# Patient Record
Sex: Female | Born: 1975 | Race: White | Hispanic: Yes | Marital: Married | Smoking: Never smoker
Health system: Southern US, Community
[De-identification: ages and names within clinical notes are randomized; demographics above are authoritative.]

## PROBLEM LIST (undated history)

## (undated) DIAGNOSIS — Z789 Other specified health status: Secondary | ICD-10-CM

## (undated) HISTORY — PX: NO PAST SURGERIES: SHX2092

---

## 2000-02-04 ENCOUNTER — Inpatient Hospital Stay (HOSPITAL_COMMUNITY): Admission: AD | Admit: 2000-02-04 | Discharge: 2000-02-04 | Payer: Self-pay | Admitting: Obstetrics

## 2000-02-11 ENCOUNTER — Encounter (HOSPITAL_COMMUNITY): Admission: RE | Admit: 2000-02-11 | Discharge: 2000-02-15 | Payer: Self-pay | Admitting: Obstetrics & Gynecology

## 2000-02-14 ENCOUNTER — Inpatient Hospital Stay (HOSPITAL_COMMUNITY): Admission: AD | Admit: 2000-02-14 | Discharge: 2000-02-17 | Payer: Self-pay | Admitting: Obstetrics & Gynecology

## 2002-05-05 ENCOUNTER — Emergency Department (HOSPITAL_COMMUNITY): Admission: EM | Admit: 2002-05-05 | Discharge: 2002-05-06 | Payer: Self-pay | Admitting: Emergency Medicine

## 2002-05-06 ENCOUNTER — Encounter: Payer: Self-pay | Admitting: Emergency Medicine

## 2004-01-27 ENCOUNTER — Inpatient Hospital Stay (HOSPITAL_COMMUNITY): Admission: AD | Admit: 2004-01-27 | Discharge: 2004-01-27 | Payer: Self-pay | Admitting: Obstetrics

## 2004-04-14 ENCOUNTER — Inpatient Hospital Stay (HOSPITAL_COMMUNITY): Admission: AD | Admit: 2004-04-14 | Discharge: 2004-04-14 | Payer: Self-pay | Admitting: Obstetrics

## 2004-05-01 ENCOUNTER — Inpatient Hospital Stay (HOSPITAL_COMMUNITY): Admission: AD | Admit: 2004-05-01 | Discharge: 2004-05-01 | Payer: Self-pay | Admitting: Obstetrics

## 2004-06-25 ENCOUNTER — Inpatient Hospital Stay (HOSPITAL_COMMUNITY): Admission: AD | Admit: 2004-06-25 | Discharge: 2004-06-25 | Payer: Self-pay | Admitting: Obstetrics

## 2004-06-30 ENCOUNTER — Inpatient Hospital Stay (HOSPITAL_COMMUNITY): Admission: AD | Admit: 2004-06-30 | Discharge: 2004-07-02 | Payer: Self-pay | Admitting: Obstetrics

## 2009-04-09 ENCOUNTER — Emergency Department (HOSPITAL_COMMUNITY): Admission: EM | Admit: 2009-04-09 | Discharge: 2009-04-10 | Payer: Self-pay | Admitting: Emergency Medicine

## 2010-08-25 LAB — BASIC METABOLIC PANEL
CO2: 25 mEq/L (ref 19–32)
Calcium: 8.5 mg/dL (ref 8.4–10.5)
Chloride: 99 mEq/L (ref 96–112)
GFR calc Af Amer: >60 mL/min (ref >60)
Sodium: 135 mEq/L (ref 135–145)

## 2010-08-25 LAB — URINALYSIS, ROUTINE W REFLEX MICROSCOPIC
Ketones, ur: 40 mg/dL — AB
Nitrite: POSITIVE — AB
Urobilinogen, UA: 2 mg/dL — ABNORMAL HIGH (ref 0.0–1.0)

## 2010-08-25 LAB — DIFFERENTIAL
Basophils Relative: 0 % (ref 0–1)
Lymphs Abs: 0.6 10*3/uL — ABNORMAL LOW (ref 0.7–4.0)
Monocytes Absolute: 1.1 10*3/uL — ABNORMAL HIGH (ref 0.1–1.0)
Monocytes Relative: 11 % (ref 3–12)
Neutro Abs: 8.3 10*3/uL — ABNORMAL HIGH (ref 1.7–7.7)

## 2010-08-25 LAB — CBC
Hemoglobin: 11.2 g/dL — ABNORMAL LOW (ref 12.0–15.0)
MCHC: 33.7 g/dL (ref 30.0–36.0)
MCV: 90.8 fL (ref 78.0–100.0)
RBC: 3.68 MIL/uL — ABNORMAL LOW (ref 3.87–5.11)
WBC: 10.1 10*3/uL (ref 4.0–10.5)

## 2010-08-25 LAB — PREGNANCY, URINE: Preg Test, Ur: NEGATIVE

## 2017-05-23 NOTE — L&D Delivery Note (Signed)
Delivery Note At 1:29 PM a viable and healthy female was delivered via Vaginal, Spontaneous (Presentation:LOA  ).  APGAR: 8, 9; weight  .   Placenta status: spontaneous and grossly intact with 3 vessel Cord:  with the following complications: .loose nuchal cord x1  After delivery of head, shoulders did not restitute completely and did not deliver right away We did [place]ro[place]ts and I was able to grasp the posterior axilla (right arm) and deliver the posterior shoulder.  Time from head to body was about 30-40 seconds.  Baby moved arm well afterward  Anesthesia:  epidural Episiotomy: None Lacerations: 2nd degree Suture Repair: 3.0 monocryl Est. Blood Loss (mL):    Mom to postpartum.  Baby to Couplet care / Skin to Skin.  Interpretor present for delivery  Linda BourgeoisMarie Kenrick Dean 04/18/2018, 2:16 PM  Please schedule this patient for Postpartum visit in: 4 weeks with the following provider: Any provider For C/S patients schedule nurse incision check in weeks 2 weeks: no Low risk pregnancy complicated by: none Delivery mode:  SVD Anticipated Birth Control:  POPs PP Procedures needed: none  Schedule Integrated BH visit: no

## 2017-10-12 ENCOUNTER — Encounter: Payer: Self-pay | Admitting: Obstetrics

## 2017-10-17 ENCOUNTER — Encounter: Payer: Self-pay | Admitting: Obstetrics

## 2017-10-17 ENCOUNTER — Ambulatory Visit (INDEPENDENT_AMBULATORY_CARE_PROVIDER_SITE_OTHER): Payer: Self-pay | Admitting: Obstetrics

## 2017-10-17 VITALS — BP 102/64 | HR 69 | Wt 137.0 lb

## 2017-10-17 DIAGNOSIS — Z124 Encounter for screening for malignant neoplasm of cervix: Secondary | ICD-10-CM

## 2017-10-17 DIAGNOSIS — O219 Vomiting of pregnancy, unspecified: Secondary | ICD-10-CM

## 2017-10-17 DIAGNOSIS — J301 Allergic rhinitis due to pollen: Secondary | ICD-10-CM

## 2017-10-17 DIAGNOSIS — O09521 Supervision of elderly multigravida, first trimester: Secondary | ICD-10-CM

## 2017-10-17 DIAGNOSIS — O0990 Supervision of high risk pregnancy, unspecified, unspecified trimester: Secondary | ICD-10-CM

## 2017-10-17 DIAGNOSIS — O09529 Supervision of elderly multigravida, unspecified trimester: Secondary | ICD-10-CM

## 2017-10-17 DIAGNOSIS — O0991 Supervision of high risk pregnancy, unspecified, first trimester: Secondary | ICD-10-CM

## 2017-10-17 DIAGNOSIS — Z1151 Encounter for screening for human papillomavirus (HPV): Secondary | ICD-10-CM

## 2017-10-17 MED ORDER — LORATADINE 10 MG PO TABS: 10.0000 mg | ORAL_TABLET | Freq: Every day | ORAL | 11 refills | Status: DC

## 2017-10-17 MED ORDER — PROMETHAZINE HCL 25 MG PO TABS: 25.0000 mg | ORAL_TABLET | Freq: Four times a day (QID) | ORAL | 2 refills | Status: DC | PRN

## 2017-10-17 MED ORDER — PNV FOLIC ACID + IRON 27-1 MG PO TABS: 1.0000 | ORAL_TABLET | Freq: Every day | ORAL | 4 refills | Status: DC

## 2017-10-17 NOTE — Progress Notes (Signed)
Subjective:    Linda Dean is being seen today for her first obstetrical visit.  This is a planned pregnancy. She is at [redacted]w[redacted]d gestation. Her obstetrical history is significant for AMA. Relationship with FOB: spouse, living together. Patient does intend to breast feed. Pregnancy history fully reviewed.  The information documented in the HPI was reviewed and verified.  Menstrual History: OB History    Gravida  4   Para  3   Term  3   Preterm      AB      Living  3     SAB      TAB      Ectopic      Multiple      Live Births  3           Patient's last menstrual period was 07/12/2017.    History reviewed. No pertinent past medical history.  Past Surgical History:  Procedure Laterality Date  . NO PAST SURGERIES       (Not in a hospital admission) Not on File  Social History   Tobacco Use  . Smoking status: Never Smoker  . Smokeless tobacco: Never Used  Substance Use Topics  . Alcohol use: Not Currently    History reviewed. No pertinent family history.   Review of Systems Constitutional: negative for weight loss Respiratory:  POSITIVE for seasonal allergic rhinitis Gastrointestinal: POSITIVE for nausea Genitourinary:negative for genital lesions and vaginal discharge and dysuria Musculoskeletal:negative for back pain Behavioral/Psych: negative for abusive relationship, depression, illegal drug usage and tobacco use    Objective:    BP 102/64   Pulse 69   Wt 137 lb (62.1 kg)   LMP 07/12/2017  General Appearance:    Alert, cooperative, no distress, appears stated age  Head:    Normocephalic, without obvious abnormality, atraumatic  Eyes:    PERRL, conjunctiva/corneas clear, EOM's intact, fundi    benign, both eyes  Ears:    Normal TM's and external ear canals, both ears  Nose:   Nares normal, septum midline, mucosa normal, no drainage    or sinus tenderness  Throat:   Lips, mucosa, and tongue normal; teeth and gums normal  Neck:    Supple, symmetrical, trachea midline, no adenopathy;    thyroid:  no enlargement/tenderness/nodules; no carotid   bruit or JVD  Back:     Symmetric, no curvature, ROM normal, no CVA tenderness  Lungs:     Clear to auscultation bilaterally, respirations unlabored  Chest Wall:    No tenderness or deformity   Heart:    Regular rate and rhythm, S1 and S2 normal, no murmur, rub   or gallop  Breast Exam:    No tenderness, masses, or nipple abnormality  Abdomen:     Soft, non-tender, bowel sounds active all four quadrants,    no masses, no organomegaly  Genitalia:    Normal female without lesion, discharge or tenderness  Extremities:   Extremities normal, atraumatic, no cyanosis or edema  Pulses:   2+ and symmetric all extremities  Skin:   Skin color, texture, turgor normal, no rashes or lesions  Lymph nodes:   Cervical, supraclavicular, and axillary nodes normal  Neurologic:   CNII-XII intact, normal strength, sensation and reflexes    throughout      Lab Review Urine pregnancy test Labs reviewed yes Radiologic studies reviewed no   Assessment:    Pregnancy at [redacted]w[redacted]d weeks    Plan:     1.  Supervision of high risk pregnancy, antepartum Rx: - Cytology - PAP - Obstetric Panel, Including HIV - Culture, OB Urine - VITAMIN D 25 Hydroxy (Vit-D Deficiency, Fractures) - Hemoglobinopathy evaluation - Hemoglobin A1c - Prenatal Vit-Fe Fumarate-FA (PNV FOLIC ACID + IRON) 27-1 MG TABS; Take 1 tablet by mouth daily before breakfast.  Dispense: 90 tablet; Refill: 4  2. Supervision of elderly multigravida, antepartum - DECLINED GENETIC COUNSELING  3. Nausea and vomiting during pregnancy prior to [redacted] weeks gestation Rx: - promethazine (PHENERGAN) 25 MG tablet; Take 1 tablet (25 mg total) by mouth every 6 (six) hours as needed for nausea or vomiting.  Dispense: 30 tablet; Refill: 2  4. Seasonal allergic rhinitis due to pollen Rx: - loratadine (CLARITIN) 10 MG tablet; Take 1 tablet (10 mg  total) by mouth daily.  Dispense: 30 tablet; Refill: 11  Prenatal vitamins.  Counseling provided regarding continued use of seat belts, cessation of alcohol consumption, smoking or use of illicit drugs; infection precautions i.e., influenza/TDAP immunizations, toxoplasmosis,CMV, parvovirus, listeria and varicella; workplace safety, exercise during pregnancy; routine dental care, safe medications, sexual activity, hot tubs, saunas, pools, travel, caffeine use, fish and methlymercury, potential toxins, hair treatments, varicose veins Weight gain recommendations per IOM guidelines reviewed: underweight/BMI< 18.5--> gain 28 - 40 lbs; normal weight/BMI 18.5 - 24.9--> gain 25 - 35 lbs; overweight/BMI 25 - 29.9--> gain 15 - 25 lbs; obese/BMI >30->gain  11 - 20 lbs Problem list reviewed and updated. FIRST/CF mutation testing/NIPT/QUAD SCREEN/fragile X/Ashkenazi Jewish population testing/Spinal muscular atrophy discussed: declined. Role of ultrasound in pregnancy discussed; fetal survey: requested. Amniocentesis discussed: declined.  Meds ordered this encounter  Medications  . promethazine (PHENERGAN) 25 MG tablet    Sig: Take 1 tablet (25 mg total) by mouth every 6 (six) hours as needed for nausea or vomiting.    Dispense:  30 tablet    Refill:  2  . loratadine (CLARITIN) 10 MG tablet    Sig: Take 1 tablet (10 mg total) by mouth daily.    Dispense:  30 tablet    Refill:  11  . Prenatal Vit-Fe Fumarate-FA (PNV FOLIC ACID + IRON) 27-1 MG TABS    Sig: Take 1 tablet by mouth daily before breakfast.    Dispense:  90 tablet    Refill:  4   Orders Placed This Encounter  Procedures  . Culture, OB Urine  . Obstetric Panel, Including HIV  . VITAMIN D 25 Hydroxy (Vit-D Deficiency, Fractures)  . Hemoglobinopathy evaluation  . Hemoglobin A1c    Follow up in 4 weeks. 50% of 20 min visit spent on counseling and coordination of care.     Brock Bad MD 10-17-2017

## 2017-10-20 ENCOUNTER — Other Ambulatory Visit: Payer: Self-pay | Admitting: Obstetrics

## 2017-10-20 DIAGNOSIS — E559 Vitamin D deficiency, unspecified: Secondary | ICD-10-CM

## 2017-10-20 LAB — HEMOGLOBINOPATHY EVALUATION
HEMOGLOBIN A2 QUANTITATION: 2.5 % (ref 1.8–3.2)
HGB C: 0 %
HGB S: 0 %
HGB VARIANT: 0 %
Hemoglobin F Quantitation: 0 % (ref 0.0–2.0)
Hgb A: 97.5 % (ref 96.4–98.8)

## 2017-10-20 LAB — OBSTETRIC PANEL, INCLUDING HIV
ANTIBODY SCREEN: NEGATIVE
BASOS: 0 %
Basophils Absolute: 0 10*3/uL (ref 0.0–0.2)
EOS (ABSOLUTE): 0.4 10*3/uL (ref 0.0–0.4)
EOS: 4 %
HEMATOCRIT: 35.2 % (ref 34.0–46.6)
HEMOGLOBIN: 11.8 g/dL (ref 11.1–15.9)
HIV Screen 4th Generation wRfx: NONREACTIVE
Hepatitis B Surface Ag: NEGATIVE
IMMATURE GRANS (ABS): 0 10*3/uL (ref 0.0–0.1)
Immature Granulocytes: 0 %
LYMPHS: 14 %
Lymphocytes Absolute: 1.3 10*3/uL (ref 0.7–3.1)
MCH: 29.9 pg (ref 26.6–33.0)
MCHC: 33.5 g/dL (ref 31.5–35.7)
MCV: 89 fL (ref 79–97)
MONOS ABS: 0.5 10*3/uL (ref 0.1–0.9)
Monocytes: 6 %
Neutrophils Absolute: 6.8 10*3/uL (ref 1.4–7.0)
Neutrophils: 76 %
Platelets: 295 10*3/uL (ref 150–450)
RBC: 3.95 x10E6/uL (ref 3.77–5.28)
RDW: 14.3 % (ref 12.3–15.4)
RH TYPE: POSITIVE
RPR Ser Ql: NONREACTIVE
RUBELLA: 2.86 {index} (ref >0.99)
WBC: 9 10*3/uL (ref 3.4–10.8)

## 2017-10-20 LAB — VITAMIN D 25 HYDROXY (VIT D DEFICIENCY, FRACTURES): Vit D, 25-Hydroxy: 17.9 ng/mL — ABNORMAL LOW (ref 30.0–100.0)

## 2017-10-20 LAB — CYTOLOGY - PAP
Diagnosis: NEGATIVE
HPV (WINDOPATH): NOT DETECTED

## 2017-10-20 LAB — HEMOGLOBIN A1C
Est. average glucose Bld gHb Est-mCnc: 111 mg/dL
Hgb A1c MFr Bld: 5.5 % (ref 4.8–5.6)

## 2017-10-20 MED ORDER — VITAMIN D 50 MCG (2000 UT) PO CAPS: 1.0000 | ORAL_CAPSULE | Freq: Every day | ORAL | 11 refills | Status: DC

## 2017-10-21 ENCOUNTER — Other Ambulatory Visit: Payer: Self-pay | Admitting: Obstetrics

## 2017-10-21 DIAGNOSIS — N3000 Acute cystitis without hematuria: Secondary | ICD-10-CM

## 2017-10-21 LAB — URINE CULTURE, OB REFLEX

## 2017-10-21 LAB — CULTURE, OB URINE

## 2017-10-21 MED ORDER — AMOXICILLIN-POT CLAVULANATE 875-125 MG PO TABS: 1.0000 | ORAL_TABLET | Freq: Two times a day (BID) | ORAL | 0 refills | Status: DC

## 2017-10-22 ENCOUNTER — Encounter: Payer: Self-pay | Admitting: Family Medicine

## 2017-10-22 DIAGNOSIS — O0990 Supervision of high risk pregnancy, unspecified, unspecified trimester: Secondary | ICD-10-CM | POA: Insufficient documentation

## 2017-10-22 DIAGNOSIS — O09519 Supervision of elderly primigravida, unspecified trimester: Secondary | ICD-10-CM | POA: Insufficient documentation

## 2017-10-24 ENCOUNTER — Encounter: Payer: Self-pay | Admitting: *Deleted

## 2017-11-14 ENCOUNTER — Other Ambulatory Visit: Payer: Self-pay

## 2017-11-14 ENCOUNTER — Encounter: Payer: Self-pay | Admitting: Obstetrics

## 2017-11-14 ENCOUNTER — Ambulatory Visit (INDEPENDENT_AMBULATORY_CARE_PROVIDER_SITE_OTHER): Payer: Self-pay | Admitting: Obstetrics

## 2017-11-14 VITALS — BP 97/60 | HR 62 | Wt 143.2 lb

## 2017-11-14 DIAGNOSIS — O09522 Supervision of elderly multigravida, second trimester: Secondary | ICD-10-CM

## 2017-11-14 DIAGNOSIS — O0992 Supervision of high risk pregnancy, unspecified, second trimester: Secondary | ICD-10-CM

## 2017-11-14 DIAGNOSIS — O09529 Supervision of elderly multigravida, unspecified trimester: Secondary | ICD-10-CM

## 2017-11-14 DIAGNOSIS — O0990 Supervision of high risk pregnancy, unspecified, unspecified trimester: Secondary | ICD-10-CM

## 2017-11-14 NOTE — Progress Notes (Signed)
Subjective:  Linda GelinasMaria De La Micael [name] is a 42 y.o. [phone] at 2035w6d being seen today for ongoing prenatal care.  She is currently monitored for the following issues for this high-risk pregnancy and has Supervision of high risk pregnancy, antepartum and AMA (advanced maternal age) primigravida 35+ on their problem list.  Patient reports no complaints.  Contractions: Not present. Vag. Bleeding: None.  Movement: Present. Denies leaking of fluid.   The following portions of the patient's history were reviewed and updated as appropriate: allergies, current medications, past family history, past medical history, past social history, past surgical history and problem list. Problem list updated.  Objective:   Vitals:   11/14/17 0825  BP: 97/60  Pulse: 62  Weight: 143 lb 3.2 oz (65 kg)    Fetal Status: Fetal Heart Rate (bpm): 150   Movement: Present     General:  Alert, oriented and cooperative. Patient is in no acute distress.  Skin: Skin is warm and dry. No rash noted.   Cardiovascular: Normal heart rate noted  Respiratory: Normal respiratory effort, no problems with respiration noted  Abdomen: Soft, gravid, appropriate for gestational age. Pain/Pressure: Absent     Pelvic:  Cervical exam deferred        Extremities: Normal range of motion.  Edema: None  Mental Status: Normal mood and affect. Normal behavior. Normal judgment and thought content.   Urinalysis:      Assessment and Plan:  Pregnancy: G4P3003 at 7435w6d  1. Supervision of high risk pregnancy, antepartum -AMA.  Declines Genetic Counseling and Testing  2. Supervision of elderly multigravida, antepartum   Preterm labor symptoms and general obstetric precautions including but not limited to vaginal bleeding, contractions, leaking of fluid and fetal movement were reviewed in detail with the patient. Please refer to After Visit Summary for other counseling recommendations.  Return in about 1 month (around 12/12/2017) for  ROB.   Brock BadHarper, Charles A, MD

## 2017-12-04 ENCOUNTER — Telehealth: Payer: Self-pay

## 2017-12-04 ENCOUNTER — Encounter: Payer: Self-pay | Admitting: Obstetrics and Gynecology

## 2017-12-04 DIAGNOSIS — O4402 Placenta previa specified as without hemorrhage, second trimester: Secondary | ICD-10-CM | POA: Insufficient documentation

## 2017-12-04 NOTE — Telephone Encounter (Signed)
Received a call from the health dept. with preliminary US results for this pt, they stated she has complete placenta previa. I called pt and spoke with her daughter who translated for the pt who speaks spanish. Gave her bleeding precautions and told her the pt needs to have complete pelvic rest, which means no intercourse. Also told her to tell pt that we will do another ultrasound in a couple months to see if the previa has resolved. I advised that nothing is wrong with the baby but reiterated that it is very important for the pt to get medical attention immediately if she begins having vaginal bleeding at any time. Pts daughter and pt verbalized understanding.

## 2017-12-12 ENCOUNTER — Ambulatory Visit (INDEPENDENT_AMBULATORY_CARE_PROVIDER_SITE_OTHER): Payer: Self-pay | Admitting: Obstetrics

## 2017-12-12 ENCOUNTER — Encounter: Payer: Self-pay | Admitting: Obstetrics

## 2017-12-12 VITALS — BP 103/65 | HR 66 | Wt 146.7 lb

## 2017-12-12 DIAGNOSIS — O09529 Supervision of elderly multigravida, unspecified trimester: Secondary | ICD-10-CM

## 2017-12-12 DIAGNOSIS — O0992 Supervision of high risk pregnancy, unspecified, second trimester: Secondary | ICD-10-CM

## 2017-12-12 DIAGNOSIS — O4402 Placenta previa specified as without hemorrhage, second trimester: Secondary | ICD-10-CM

## 2017-12-12 DIAGNOSIS — O09522 Supervision of elderly multigravida, second trimester: Secondary | ICD-10-CM

## 2017-12-12 DIAGNOSIS — O0990 Supervision of high risk pregnancy, unspecified, unspecified trimester: Secondary | ICD-10-CM

## 2017-12-12 NOTE — Progress Notes (Signed)
Patient reports good fetal movement with occasional uterine irritability. 

## 2017-12-12 NOTE — Progress Notes (Signed)
Subjective:  Linda GelinasMaria De La Micael [name] is a 42 y.o. [phone] at 8211w6d being seen today for ongoing prenatal care.  She is currently monitored for the following issues for this high-risk pregnancy and has Supervision of high risk pregnancy, antepartum; AMA (advanced maternal age) primigravida 35+; and Complete placenta previa nos or without hemorrhage, second trimester on their problem list.  Patient reports backache.  Contractions: Irritability. Vag. Bleeding: None.  Movement: Present. Denies leaking of fluid.   The following portions of the patient's history were reviewed and updated as appropriate: allergies, current medications, past family history, past medical history, past social history, past surgical history and problem list. Problem list updated.  Objective:   Vitals:   12/12/17 1106  BP: 103/65  Pulse: 66  Weight: 146 lb 11.2 oz (66.5 kg)    Fetal Status: Fetal Heart Rate (bpm): 150   Movement: Present     General:  Alert, oriented and cooperative. Patient is in no acute distress.  Skin: Skin is warm and dry. No rash noted.   Cardiovascular: Normal heart rate noted  Respiratory: Normal respiratory effort, no problems with respiration noted  Abdomen: Soft, gravid, appropriate for gestational age. Pain/Pressure: Present     Pelvic:  Cervical exam deferred        Extremities: Normal range of motion.  Edema: None  Mental Status: Normal mood and affect. Normal behavior. Normal judgment and thought content.   Urinalysis:      Assessment and Plan:  Pregnancy: [id number] at 1711w6d  1. Supervision of high risk pregnancy, antepartum  2. Supervision of elderly multigravida, antepartum  3. Total placenta previa, second trimester - follow up ultrasound at 28 weeks - previa precautions  Preterm labor symptoms and general obstetric precautions including but not limited to vaginal bleeding, contractions, leaking of fluid and fetal movement were reviewed in detail with the patient. Please  refer to After Visit Summary for other counseling recommendations.  Return in about 1 month (around 01/09/2018) for ROB.   Brock BadHarper, Samai Corea A, MD

## 2018-01-09 ENCOUNTER — Encounter: Payer: Self-pay | Admitting: Obstetrics

## 2018-01-09 ENCOUNTER — Ambulatory Visit (INDEPENDENT_AMBULATORY_CARE_PROVIDER_SITE_OTHER): Payer: Self-pay | Admitting: Obstetrics

## 2018-01-09 DIAGNOSIS — O0992 Supervision of high risk pregnancy, unspecified, second trimester: Secondary | ICD-10-CM

## 2018-01-09 DIAGNOSIS — O0990 Supervision of high risk pregnancy, unspecified, unspecified trimester: Secondary | ICD-10-CM

## 2018-01-09 NOTE — Progress Notes (Signed)
Patient reports good fetal movement, denies pain. 

## 2018-01-09 NOTE — Progress Notes (Signed)
Subjective:  Linda GelinasMaria De La Micael [name] is a 42 y.o. [phone] at 1470w6d being seen today for ongoing prenatal care.  She is currently monitored for the following issues for this high-risk pregnancy and has Supervision of high risk pregnancy, antepartum; AMA (advanced maternal age) primigravida 35+; and Complete placenta previa nos or without hemorrhage, second trimester on their problem list.  Patient reports no complaints.  Contractions: Not present. Vag. Bleeding: None.  Movement: Present. Denies leaking of fluid.   The following portions of the patient's history were reviewed and updated as appropriate: allergies, current medications, past family history, past medical history, past social history, past surgical history and problem list. Problem list updated.  Objective:   Vitals:   01/09/18 1036  BP: (!) 103/59  Pulse: 66  Weight: 149 lb 6.4 oz (67.8 kg)    Fetal Status: Fetal Heart Rate (bpm): 150   Movement: Present     General:  Alert, oriented and cooperative. Patient is in no acute distress.  Skin: Skin is warm and dry. No rash noted.   Cardiovascular: Normal heart rate noted  Respiratory: Normal respiratory effort, no problems with respiration noted  Abdomen: Soft, gravid, appropriate for gestational age. Pain/Pressure: Absent     Pelvic:  Cervical exam deferred        Extremities: Normal range of motion.  Edema: None  Mental Status: Normal mood and affect. Normal behavior. Normal judgment and thought content.   Urinalysis:      Assessment and Plan:  Pregnancy: G4P3003 at 8570w6d  1. Supervision of high risk pregnancy, antepartum   Preterm labor symptoms and general obstetric precautions including but not limited to vaginal bleeding, contractions, leaking of fluid and fetal movement were reviewed in detail with the patient. Please refer to After Visit Summary for other counseling recommendations.  Return in about 3 weeks (around 01/30/2018) for ROB, 2 hour OGTT.   Brock BadHarper,  Charles A, MD

## 2018-01-30 ENCOUNTER — Ambulatory Visit (INDEPENDENT_AMBULATORY_CARE_PROVIDER_SITE_OTHER): Payer: Self-pay | Admitting: Obstetrics

## 2018-01-30 ENCOUNTER — Other Ambulatory Visit: Payer: Self-pay

## 2018-01-30 ENCOUNTER — Encounter: Payer: Self-pay | Admitting: Obstetrics

## 2018-01-30 VITALS — BP 110/70 | HR 71 | Wt 151.0 lb

## 2018-01-30 DIAGNOSIS — O0993 Supervision of high risk pregnancy, unspecified, third trimester: Secondary | ICD-10-CM

## 2018-01-30 DIAGNOSIS — O0990 Supervision of high risk pregnancy, unspecified, unspecified trimester: Secondary | ICD-10-CM

## 2018-01-30 DIAGNOSIS — O4403 Placenta previa specified as without hemorrhage, third trimester: Secondary | ICD-10-CM

## 2018-01-30 DIAGNOSIS — O09523 Supervision of elderly multigravida, third trimester: Secondary | ICD-10-CM

## 2018-01-30 NOTE — Progress Notes (Signed)
Subjective:  Linda Dean is a 42 y.o. [phone] at [redacted]w[redacted]d being seen today for ongoing prenatal care.  She is currently monitored for the following issues for this high-risk pregnancy and has Supervision of high risk pregnancy, antepartum; AMA (advanced maternal age) primigravida 35+; and Complete placenta previa nos or without hemorrhage, second trimester on their problem list.  Patient reports no complaints.  Contractions: Irregular. Vag. Bleeding: None.  Movement: Present. Denies leaking of fluid.   The following portions of the patient's history were reviewed and updated as appropriate: allergies, current medications, past family history, past medical history, past social history, past surgical history and problem list. Problem list updated.  Objective:   Vitals:   01/30/18 0824  BP: 110/70  Pulse: 71  Weight: 151 lb (68.5 kg)    Fetal Status:     Movement: Present     General:  Alert, oriented and cooperative. Patient is in no acute distress.  Skin: Skin is warm and dry. No rash noted.   Cardiovascular: Normal heart rate noted  Respiratory: Normal respiratory effort, no problems with respiration noted  Abdomen: Soft, gravid, appropriate for gestational age. Pain/Pressure: Present     Pelvic:  Cervical exam deferred        Extremities: Normal range of motion.  Edema: None  Mental Status: Normal mood and affect. Normal behavior. Normal judgment and thought content.   Urinalysis:      Assessment and Plan:  Pregnancy: G4P3003 at [redacted]w[redacted]d  1. Supervision of high risk pregnancy, antepartum Rx: - Glucose Tolerance, 2 Hours w/1 Hour - CBC - HIV antibody (with reflex) - RPR  Preterm labor symptoms and general obstetric precautions including but not limited to vaginal bleeding, contractions, leaking of fluid and fetal movement were reviewed in detail with the patient. Please refer to After Visit Summary for other counseling recommendations.  Return in about 2 weeks (around  02/13/2018) for ROB.   Brock Bad, MD

## 2018-01-30 NOTE — Progress Notes (Signed)
ROB/GTT.  [facility] given [facility] FLU and TDAP vaccines.

## 2018-01-31 LAB — CBC
Hematocrit: 35 % (ref 34.0–46.6)
Hemoglobin: 11.3 g/dL (ref 11.1–15.9)
MCH: 30.4 pg (ref 26.6–33.0)
MCHC: 32.3 g/dL (ref 31.5–35.7)
MCV: 94 fL (ref 79–97)
PLATELETS: 287 10*3/uL (ref 150–450)
RBC: 3.72 x10E6/uL — AB (ref 3.77–5.28)
RDW: 12.8 % (ref 12.3–15.4)
WBC: 6.8 10*3/uL (ref 3.4–10.8)

## 2018-01-31 LAB — GLUCOSE TOLERANCE, 2 HOURS W/ 1HR
Glucose, 1 hour: 157 mg/dL (ref 65–179)
Glucose, 2 hour: 107 mg/dL (ref 65–152)
Glucose, Fasting: 84 mg/dL (ref 65–91)

## 2018-01-31 LAB — RPR: RPR Ser Ql: NONREACTIVE

## 2018-01-31 LAB — HIV ANTIBODY (ROUTINE TESTING W REFLEX): HIV SCREEN 4TH GENERATION: NONREACTIVE

## 2018-02-13 ENCOUNTER — Ambulatory Visit (INDEPENDENT_AMBULATORY_CARE_PROVIDER_SITE_OTHER): Payer: Self-pay | Admitting: Obstetrics

## 2018-02-13 ENCOUNTER — Encounter: Payer: Self-pay | Admitting: Obstetrics

## 2018-02-13 VITALS — BP 109/62 | HR 69 | Wt 153.0 lb

## 2018-02-13 DIAGNOSIS — O0990 Supervision of high risk pregnancy, unspecified, unspecified trimester: Secondary | ICD-10-CM

## 2018-02-13 DIAGNOSIS — O4443 Low lying placenta NOS or without hemorrhage, third trimester: Secondary | ICD-10-CM

## 2018-02-13 DIAGNOSIS — O09529 Supervision of elderly multigravida, unspecified trimester: Secondary | ICD-10-CM

## 2018-02-13 NOTE — Progress Notes (Signed)
Subjective:  Linda Dean is a 42 y.o. [phone] at 6856w6d being seen today for ongoing prenatal care.  She is currently monitored for the following issues for this high-risk pregnancy and has Supervision of high risk pregnancy, antepartum; AMA (advanced maternal age) primigravida 35+; and Complete placenta previa nos or without hemorrhage, second trimester on their problem list.  Patient reports no complaints.  Contractions: Not present. Vag. Bleeding: None.  Movement: Present. Denies leaking of fluid.   The following portions of the patient's history were reviewed and updated as appropriate: allergies, current medications, past family history, past medical history, past social history, past surgical history and problem list. Problem list updated.  Objective:   Vitals:   02/13/18 1034  BP: 109/62  Pulse: 69  Weight: 153 lb (69.4 kg)    Fetal Status: Fetal Heart Rate (bpm): 150   Movement: Present     General:  Alert, oriented and cooperative. Patient is in no acute distress.  Skin: Skin is warm and dry. No rash noted.   Cardiovascular: Normal heart rate noted  Respiratory: Normal respiratory effort, no problems with respiration noted  Abdomen: Soft, gravid, appropriate for gestational age. Pain/Pressure: Present     Pelvic:  Cervical exam deferred        Extremities: Normal range of motion.  Edema: None  Mental Status: Normal mood and affect. Normal behavior. Normal judgment and thought content.   Urinalysis:      Assessment and Plan:  Pregnancy: G4P3003 at 4156w6d  1. Supervision of high risk pregnancy, antepartum  2. Supervision of elderly multigravida, antepartum  3. Low lying anterior placenta nos or without hemorrhage, third trimester ( 1.2CM FROM CERVICAL OS ) - WILL REPEAT ULTRASOUND IN 1 MONTH  Media Information       Document Information   Procedure Note  [place] Report-[facility]  02/07/2018 09:01  Attached To:  Procedure Report - Scanned  [[id number]]  Scanned Document on 02/07/18 with [provider]  Source Information   Almon Registeraniels, Aneetra D  [facility]    Preterm labor symptoms and general obstetric precautions including but not limited to vaginal bleeding, contractions, leaking of fluid and fetal movement were reviewed in detail with the patient. Please refer to After Visit Summary for other counseling recommendations.  No follow-ups on file.   Brock BadHarper, Charles A, MD

## 2018-03-01 ENCOUNTER — Encounter: Payer: Self-pay | Admitting: Obstetrics

## 2018-03-01 ENCOUNTER — Ambulatory Visit (INDEPENDENT_AMBULATORY_CARE_PROVIDER_SITE_OTHER): Payer: Self-pay | Admitting: Obstetrics

## 2018-03-01 VITALS — BP 96/53 | HR 76 | Wt 154.3 lb

## 2018-03-01 DIAGNOSIS — O4443 Low lying placenta NOS or without hemorrhage, third trimester: Secondary | ICD-10-CM

## 2018-03-01 DIAGNOSIS — O09523 Supervision of elderly multigravida, third trimester: Secondary | ICD-10-CM

## 2018-03-01 DIAGNOSIS — O09529 Supervision of elderly multigravida, unspecified trimester: Secondary | ICD-10-CM

## 2018-03-01 DIAGNOSIS — O0990 Supervision of high risk pregnancy, unspecified, unspecified trimester: Secondary | ICD-10-CM

## 2018-03-01 DIAGNOSIS — O0993 Supervision of high risk pregnancy, unspecified, third trimester: Secondary | ICD-10-CM

## 2018-03-01 NOTE — Progress Notes (Signed)
Pt presents for ROB. Pt has no concerns today. 

## 2018-03-01 NOTE — Progress Notes (Signed)
Subjective:  Linda Dean is a 42 y.o. [phone] at [redacted]w[redacted]d being seen today for ongoing prenatal care.  She is currently monitored for the following issues for this high-risk pregnancy and has Supervision of high risk pregnancy, antepartum; AMA (advanced maternal age) primigravida 35+; and Complete placenta previa nos or without hemorrhage, second trimester on their problem list.  Patient reports no complaints.  Contractions: Irregular. Vag. Bleeding: None.  Movement: Present. Denies leaking of fluid.   The following portions of the patient's history were reviewed and updated as appropriate: allergies, current medications, past family history, past medical history, past social history, past surgical history and problem list. Problem list updated.  Objective:   Vitals:   03/01/18 1000  BP: (!) 96/53  Pulse: 76  Weight: 154 lb 4.8 oz (70 kg)    Fetal Status: Fetal Heart Rate (bpm): 150   Movement: Present     General:  Alert, oriented and cooperative. Patient is in no acute distress.  Skin: Skin is warm and dry. No rash noted.   Cardiovascular: Normal heart rate noted  Respiratory: Normal respiratory effort, no problems with respiration noted  Abdomen: Soft, gravid, appropriate for gestational age. Pain/Pressure: Present     Pelvic:  Cervical exam deferred        Extremities: Normal range of motion.  Edema: None  Mental Status: Normal mood and affect. Normal behavior. Normal judgment and thought content.   Urinalysis:     Ultrasound: Media Information       Document Information   Procedure Note  [place] Report[facility]  02/07/2018 09:01  Attached To:  Procedure Report - Scanned [[id number]]  Scanned Document on 02/07/18 with [provider]  Source Information   Almon Register D  [facility]    Assessment and Plan:  Pregnancy: G4P3003 at [redacted]w[redacted]d  1. Supervision of high risk pregnancy, antepartum  2. Supervision of elderly  multigravida, antepartum  3. Low lying placenta nos or without hemorrhage, third trimester   Preterm labor symptoms and general obstetric precautions including but not limited to vaginal bleeding, contractions, leaking of fluid and fetal movement were reviewed in detail with the patient. Please refer to After Visit Summary for other counseling recommendations.  Return in about 2 weeks (around 03/15/2018) for ROB.   Brock Bad, MD

## 2018-03-13 ENCOUNTER — Ambulatory Visit (INDEPENDENT_AMBULATORY_CARE_PROVIDER_SITE_OTHER): Payer: Self-pay | Admitting: Obstetrics

## 2018-03-13 ENCOUNTER — Encounter: Payer: Self-pay | Admitting: Obstetrics

## 2018-03-13 VITALS — BP 110/58 | HR 80 | Wt 158.0 lb

## 2018-03-13 DIAGNOSIS — O09529 Supervision of elderly multigravida, unspecified trimester: Secondary | ICD-10-CM

## 2018-03-13 DIAGNOSIS — O09523 Supervision of elderly multigravida, third trimester: Secondary | ICD-10-CM

## 2018-03-13 DIAGNOSIS — O0993 Supervision of high risk pregnancy, unspecified, third trimester: Secondary | ICD-10-CM

## 2018-03-13 DIAGNOSIS — O0990 Supervision of high risk pregnancy, unspecified, unspecified trimester: Secondary | ICD-10-CM

## 2018-03-13 DIAGNOSIS — O4443 Low lying placenta NOS or without hemorrhage, third trimester: Secondary | ICD-10-CM

## 2018-03-13 NOTE — Progress Notes (Signed)
ROB.  Declined FLU and TDAP.

## 2018-03-13 NOTE — Progress Notes (Signed)
Subjective:  Linda Dean Celina Shiley is a 42 y.o. [phone] at [redacted]w[redacted]d being seen today for ongoing prenatal care.  She is currently monitored for the following issues for this high-risk pregnancy and has Supervision of high risk pregnancy, antepartum; AMA (advanced maternal age) primigravida 35+; and Complete placenta previa nos or without hemorrhage, second trimester on their problem list.  Patient reports no complaints.  Contractions: Irritability. Vag. Bleeding: None.  Movement: Present. Denies leaking of fluid.   The following portions of the patient's history were reviewed and updated as appropriate: allergies, current medications, past family history, past medical history, past social history, past surgical history and problem list. Problem list updated.  Objective:   Vitals:   03/13/18 1546  BP: (!) 110/58  Pulse: 80  Weight: 158 lb (71.7 kg)    Fetal Status: Fetal Heart Rate (bpm): 150   Movement: Present     General:  Alert, oriented and cooperative. Patient is in no acute distress.  Skin: Skin is warm and dry. No rash noted.   Cardiovascular: Normal heart rate noted  Respiratory: Normal respiratory effort, no problems with respiration noted  Abdomen: Soft, gravid, appropriate for gestational age. Pain/Pressure: Present     Pelvic:  Cervical exam deferred        Extremities: Normal range of motion.  Edema: None  Mental Status: Normal mood and affect. Normal behavior. Normal judgment and thought content.   Urinalysis:      ULTRASOUND: Media Information       Document Information   Procedure Note  [place] Report-[facility]  03/08/2018 11:57  Attached To:  Procedure Report - Scanned [[id number]]  Scanned Document on 03/08/18 with [provider]  Source Information   Almon Register D  [facility]    Assessment and Plan:  Pregnancy: G4P3003 at [redacted]w[redacted]d  1. Supervision of high risk pregnancy, antepartum  2. Supervision of elderly  multigravida, antepartum  3. Low lying placenta nos or without hemorrhage, third trimester - RESOLVED, ON ULTRASOUND 03-08-2018.  NO PREVIA.   Preterm labor symptoms and general obstetric precautions including but not limited to vaginal bleeding, contractions, leaking of fluid and fetal movement were reviewed in detail with the patient. Please refer to After Visit Summary for other counseling recommendations.  Return in about 1 week (around 03/20/2018) for ROB.   Brock Bad, MD

## 2018-03-22 ENCOUNTER — Ambulatory Visit (INDEPENDENT_AMBULATORY_CARE_PROVIDER_SITE_OTHER): Payer: Self-pay | Admitting: Obstetrics

## 2018-03-22 ENCOUNTER — Encounter: Payer: Self-pay | Admitting: Obstetrics

## 2018-03-22 VITALS — BP 107/62 | HR 74 | Wt 155.8 lb

## 2018-03-22 DIAGNOSIS — Z113 Encounter for screening for infections with a predominantly sexual mode of transmission: Secondary | ICD-10-CM

## 2018-03-22 DIAGNOSIS — O09529 Supervision of elderly multigravida, unspecified trimester: Secondary | ICD-10-CM

## 2018-03-22 DIAGNOSIS — O0990 Supervision of high risk pregnancy, unspecified, unspecified trimester: Secondary | ICD-10-CM

## 2018-03-22 LAB — OB RESULTS CONSOLE GC/CHLAMYDIA: Gonorrhea: NEGATIVE

## 2018-03-22 NOTE — Progress Notes (Signed)
Subjective:  Linda Dean Clinton Wahlberg is a 42 y.o. [phone] at [redacted]w[redacted]d being seen today for ongoing prenatal care.  She is currently monitored for the following issues for this high-risk pregnancy and has Supervision of high risk pregnancy, antepartum; AMA (advanced maternal age) primigravida 35+; and Complete placenta previa nos or without hemorrhage, second trimester on their problem list.  Patient reports heartburn.  Contractions: Irregular. Vag. Bleeding: None.  Movement: Present. Denies leaking of fluid.   The following portions of the patient's history were reviewed and updated as appropriate: allergies, current medications, past family history, past medical history, past social history, past surgical history and problem list. Problem list updated.  Objective:   Vitals:   03/22/18 1623  BP: 107/62  Pulse: 74  Weight: 155 lb 12.8 oz (70.7 kg)    Fetal Status:     Movement: Present     General:  Alert, oriented and cooperative. Patient is in no acute distress.  Skin: Skin is warm and dry. No rash noted.   Cardiovascular: Normal heart rate noted  Respiratory: Normal respiratory effort, no problems with respiration noted  Abdomen: Soft, gravid, appropriate for gestational age. Pain/Pressure: Present     Pelvic:  Cervical exam deferred        Extremities: Normal range of motion.  Edema: Trace  Mental Status: Normal mood and affect. Normal behavior. Normal judgment and thought content.   Urinalysis:      Assessment and Plan:  Pregnancy: G4P3003 at [redacted]w[redacted]d  1. Supervision of high risk pregnancy, antepartum Rx: - Strep Gp B NAA - GC/Chlamydia probe amp ([place])not at [facility]  2. Supervision of elderly multigravida, antepartum   There are no diagnoses linked to this encounter. Preterm labor symptoms and general obstetric precautions including but not limited to vaginal bleeding, contractions, leaking of fluid and fetal movement were reviewed in detail with the patient. Please refer  to After Visit Summary for other counseling recommendations.  Return in about 1 week (around 03/29/2018) for ROB.   Brock Bad, MD

## 2018-03-23 LAB — GC/CHLAMYDIA PROBE AMP (~~LOC~~) NOT AT ARMC
CHLAMYDIA, DNA PROBE: NEGATIVE
NEISSERIA GONORRHEA: NEGATIVE

## 2018-03-24 LAB — STREP GP B NAA: Strep Gp B NAA: NEGATIVE

## 2018-03-27 ENCOUNTER — Encounter: Payer: Self-pay | Admitting: Obstetrics

## 2018-03-27 ENCOUNTER — Ambulatory Visit (INDEPENDENT_AMBULATORY_CARE_PROVIDER_SITE_OTHER): Payer: Self-pay | Admitting: Obstetrics

## 2018-03-27 VITALS — BP 109/67 | HR 73 | Ht 60.0 in | Wt 157.4 lb

## 2018-03-27 DIAGNOSIS — O0990 Supervision of high risk pregnancy, unspecified, unspecified trimester: Secondary | ICD-10-CM

## 2018-03-27 DIAGNOSIS — O09529 Supervision of elderly multigravida, unspecified trimester: Secondary | ICD-10-CM

## 2018-03-27 NOTE — Progress Notes (Signed)
Subjective:  Linda Dean is a 42 y.o. [phone] at [redacted]w[redacted]d being seen today for ongoing prenatal care.  She is currently monitored for the following issues for this high-risk pregnancy and has Supervision of high risk pregnancy, antepartum; AMA (advanced maternal age) primigravida 35+; and Complete placenta previa nos or without hemorrhage, second trimester on their problem list.  Patient reports no complaints.  Contractions: Irregular. Vag. Bleeding: None.  Movement: Present. Denies leaking of fluid.   The following portions of the patient's history were reviewed and updated as appropriate: allergies, current medications, past family history, past medical history, past social history, past surgical history and problem list. Problem list updated.  Objective:   Vitals:   03/27/18 1317 03/27/18 1318  BP: 109/67   Pulse: 73   Weight: 157 lb 6.4 oz (71.4 kg)   Height:  5' (1.524 m)    Fetal Status: Fetal Heart Rate (bpm): 150   Movement: Present     General:  Alert, oriented and cooperative. Patient is in no acute distress.  Skin: Skin is warm and dry. No rash noted.   Cardiovascular: Normal heart rate noted  Respiratory: Normal respiratory effort, no problems with respiration noted  Abdomen: Soft, gravid, appropriate for gestational age. Pain/Pressure: Present     Pelvic:  Cervical exam deferred        Extremities: Normal range of motion.  Edema: None  Mental Status: Normal mood and affect. Normal behavior. Normal judgment and thought content.   Urinalysis:      Assessment and Plan:  Pregnancy: G4P3003 at [redacted]w[redacted]d  1. Supervision of high risk pregnancy, antepartum  2. Supervision of elderly multigravida, antepartum  Term labor symptoms and general obstetric precautions including but not limited to vaginal bleeding, contractions, leaking of fluid and fetal movement were reviewed in detail with the patient. Please refer to After Visit Summary for other counseling  recommendations.  Return in about 1 week (around 04/03/2018) for ROB.   Brock Bad, MD

## 2018-04-03 ENCOUNTER — Ambulatory Visit (INDEPENDENT_AMBULATORY_CARE_PROVIDER_SITE_OTHER): Payer: Self-pay | Admitting: Obstetrics

## 2018-04-03 ENCOUNTER — Encounter: Payer: Self-pay | Admitting: Obstetrics

## 2018-04-03 VITALS — BP 107/64 | HR 64 | Wt 156.0 lb

## 2018-04-03 DIAGNOSIS — O0990 Supervision of high risk pregnancy, unspecified, unspecified trimester: Secondary | ICD-10-CM

## 2018-04-03 DIAGNOSIS — O09513 Supervision of elderly primigravida, third trimester: Secondary | ICD-10-CM

## 2018-04-03 NOTE — Progress Notes (Signed)
Subjective:  Linda GelinasMaria De La Micael [name] is a 42 y.o. [phone] at 1918w6d being seen today for ongoing prenatal care.  She is currently monitored for the following issues for this high-risk pregnancy and has Supervision of high risk pregnancy, antepartum; AMA (advanced maternal age) primigravida 35+; and Complete placenta previa nos or without hemorrhage, second trimester on their problem list.  Patient reports no complaints.  Contractions: Irregular. Vag. Bleeding: None.  Movement: Present. Denies leaking of fluid.   The following portions of the patient's history were reviewed and updated as appropriate: allergies, current medications, past family history, past medical history, past social history, past surgical history and problem list. Problem list updated.  Objective:   Vitals:   04/03/18 1452  BP: 107/64  Pulse: 64  Weight: 156 lb (70.8 kg)    Fetal Status: Fetal Heart Rate (bpm): 150   Movement: Present     General:  Alert, oriented and cooperative. Patient is in no acute distress.  Skin: Skin is warm and dry. No rash noted.   Cardiovascular: Normal heart rate noted  Respiratory: Normal respiratory effort, no problems with respiration noted  Abdomen: Soft, gravid, appropriate for gestational age. Pain/Pressure: Present     Pelvic:  Cervical exam deferred        Extremities: Normal range of motion.  Edema: None  Mental Status: Normal mood and affect. Normal behavior. Normal judgment and thought content.   Urinalysis:      Assessment and Plan:  Pregnancy: G4P3003 at 7418w6d                                                                                                                                                                                                                                  1. Supervision of high risk pregnancy, antepartum   2. AMA (advanced maternal age) primigravida 35+, third trimester Rx: - NST:  REACTIVE.  GOOD VARIABILITY.  15X15 ACCELS.  NO  DECELS.   Term labor symptoms and general obstetric precautions including but not limited to vaginal bleeding, contractions, leaking of fluid and fetal movement were reviewed in detail with the patient. Please refer to After Visit Summary for other counseling recommendations.  Return in about 1 week (around 04/10/2018) for ROB.   Brock BadHarper, Charles A, MD

## 2018-04-11 ENCOUNTER — Ambulatory Visit (INDEPENDENT_AMBULATORY_CARE_PROVIDER_SITE_OTHER): Payer: Self-pay | Admitting: Obstetrics

## 2018-04-11 ENCOUNTER — Encounter: Payer: Self-pay | Admitting: Obstetrics

## 2018-04-11 VITALS — BP 107/64 | HR 71 | Wt 160.0 lb

## 2018-04-11 DIAGNOSIS — O0990 Supervision of high risk pregnancy, unspecified, unspecified trimester: Secondary | ICD-10-CM

## 2018-04-11 DIAGNOSIS — O0993 Supervision of high risk pregnancy, unspecified, third trimester: Secondary | ICD-10-CM

## 2018-04-11 NOTE — Progress Notes (Signed)
Subjective:  Linda Dean is a 42 y.o. [phone] at 7589w0d being seen today for ongoing prenatal care.  She is currently monitored for the following issues for this high-risk pregnancy and has Supervision of high risk pregnancy, antepartum; AMA (advanced maternal age) primigravida 35+; and Complete placenta previa nos or without hemorrhage, second trimester on their problem list.  Patient reports occasional contractions.  Contractions: Irritability. Vag. Bleeding: None.  Movement: Present. Denies leaking of fluid.   The following portions of the patient's history were reviewed and updated as appropriate: allergies, current medications, past family history, past medical history, past social history, past surgical history and problem list. Problem list updated.  Objective:   Vitals:   04/11/18 1625  BP: 107/64  Pulse: 71  Weight: 160 lb (72.6 kg)    Fetal Status:     Movement: Present     General:  Alert, oriented and cooperative. Patient is in no acute distress.  Skin: Skin is warm and dry. No rash noted.   Cardiovascular: Normal heart rate noted  Respiratory: Normal respiratory effort, no problems with respiration noted  Abdomen: Soft, gravid, appropriate for gestational age. Pain/Pressure: Present     Pelvic:  Cervical exam deferred        Extremities: Normal range of motion.  Edema: Trace  Mental Status: Normal mood and affect. Normal behavior. Normal judgment and thought content.   Urinalysis:      Assessment and Plan:  Pregnancy: G4P3003 at 3689w0d  1. Supervision of high risk pregnancy, antepartum Rx: - Fetal nonstress test: Reactive.  Good variability.  15x15 accels.  No decels   [facility] at [facility]redacted[facility]  Term labor symptoms and general obstetric precautions including but not limited to vaginal bleeding, contractions, leaking of fluid and fetal movement were reviewed in detail with the patient. Please refer to After Visit Summary for other  counseling recommendations.  Return in about 6 weeks (around 05/23/2018) for Postpartum.   Brock BadHarper, Charles A, MD

## 2018-04-12 ENCOUNTER — Telehealth (HOSPITAL_COMMUNITY): Payer: Self-pay | Admitting: *Deleted

## 2018-04-12 NOTE — Telephone Encounter (Signed)
Interpreter number [phone] Preadmission screen

## 2018-04-17 ENCOUNTER — Inpatient Hospital Stay (HOSPITAL_BASED_OUTPATIENT_CLINIC_OR_DEPARTMENT_OTHER): Payer: Medicaid Other

## 2018-04-17 ENCOUNTER — Inpatient Hospital Stay (HOSPITAL_COMMUNITY)
Admission: AD | Admit: 2018-04-17 | Discharge: 2018-04-20 | DRG: 807 | Disposition: A | Discharge status: Home / Self Care | Payer: Medicaid Other | Attending: Family Medicine | Admitting: Family Medicine

## 2018-04-17 ENCOUNTER — Encounter (HOSPITAL_COMMUNITY): Payer: Self-pay

## 2018-04-17 ENCOUNTER — Ambulatory Visit (INDEPENDENT_AMBULATORY_CARE_PROVIDER_SITE_OTHER): Payer: Medicaid Other | Admitting: Obstetrics

## 2018-04-17 ENCOUNTER — Other Ambulatory Visit: Payer: Self-pay

## 2018-04-17 ENCOUNTER — Encounter: Payer: Self-pay | Admitting: Obstetrics

## 2018-04-17 VITALS — BP 105/62 | HR 72 | Wt 160.0 lb

## 2018-04-17 DIAGNOSIS — O4402 Placenta previa specified as without hemorrhage, second trimester: Secondary | ICD-10-CM | POA: Diagnosis present

## 2018-04-17 DIAGNOSIS — Z3A39 39 weeks gestation of pregnancy: Secondary | ICD-10-CM

## 2018-04-17 DIAGNOSIS — O368130 Decreased fetal movements, third trimester, not applicable or unspecified: Secondary | ICD-10-CM | POA: Diagnosis present

## 2018-04-17 DIAGNOSIS — O09523 Supervision of elderly multigravida, third trimester: Secondary | ICD-10-CM

## 2018-04-17 DIAGNOSIS — Z758 Other problems related to medical facilities and other health care: Secondary | ICD-10-CM

## 2018-04-17 DIAGNOSIS — O0990 Supervision of high risk pregnancy, unspecified, unspecified trimester: Secondary | ICD-10-CM

## 2018-04-17 DIAGNOSIS — O09529 Supervision of elderly multigravida, unspecified trimester: Secondary | ICD-10-CM

## 2018-04-17 DIAGNOSIS — Z789 Other specified health status: Secondary | ICD-10-CM

## 2018-04-17 HISTORY — DX: Other specified health status: Z78.9

## 2018-04-17 LAB — ABO/RH: ABO/RH(D): A POS

## 2018-04-17 LAB — CBC
HCT: 38.1 % (ref 36.0–46.0)
Hemoglobin: 12.4 g/dL (ref 12.0–15.0)
MCH: 29.9 pg (ref 26.0–34.0)
MCHC: 32.5 g/dL (ref 30.0–36.0)
MCV: 91.8 fL (ref 80.0–100.0)
PLATELETS: 301 10*3/uL (ref 150–400)
RBC: 4.15 MIL/uL (ref 3.87–5.11)
RDW: 13.6 % (ref 11.5–15.5)
WBC: 8.8 10*3/uL (ref 4.0–10.5)
nRBC: 0 % (ref 0.0–0.2)

## 2018-04-17 LAB — TYPE AND SCREEN
ABO/RH(D): A POS
Antibody Screen: NEGATIVE

## 2018-04-17 MED ORDER — OXYCODONE-ACETAMINOPHEN 5-325 MG PO TABS: 1.0000 | ORAL_TABLET | ORAL | Status: DC | PRN

## 2018-04-17 MED ORDER — LIDOCAINE HCL (PF) 1 % IJ SOLN
30.0000 mL | INTRAMUSCULAR | Status: DC | PRN
  Filled 2018-04-17: qty 30

## 2018-04-17 MED ORDER — OXYTOCIN 40 UNITS IN LACTATED RINGERS INFUSION - SIMPLE MED
1.0000 m[IU]/min | INTRAVENOUS | Status: DC
  Administered 2018-04-18: 2 m[IU]/min via INTRAVENOUS
  Filled 2018-04-17: qty 1000

## 2018-04-17 MED ORDER — HYDROXYZINE HCL 50 MG PO TABS
50.0000 mg | ORAL_TABLET | Freq: Four times a day (QID) | ORAL | Status: DC | PRN
  Filled 2018-04-17: qty 1

## 2018-04-17 MED ORDER — TERBUTALINE SULFATE 1 MG/ML IJ SOLN
0.2500 mg | Freq: Once | INTRAMUSCULAR | Status: DC | PRN
  Filled 2018-04-17: qty 1

## 2018-04-17 MED ORDER — LACTATED RINGERS IV SOLN
INTRAVENOUS | Status: DC
  Administered 2018-04-17 – 2018-04-18 (×2): via INTRAVENOUS

## 2018-04-17 MED ORDER — ACETAMINOPHEN 325 MG PO TABS: 650.0000 mg | ORAL_TABLET | ORAL | Status: DC | PRN

## 2018-04-17 MED ORDER — OXYCODONE-ACETAMINOPHEN 5-325 MG PO TABS: 2.0000 | ORAL_TABLET | ORAL | Status: DC | PRN

## 2018-04-17 MED ORDER — LACTATED RINGERS IV SOLN: 500.0000 mL | INTRAVENOUS | Status: DC | PRN

## 2018-04-17 MED ORDER — FENTANYL CITRATE (PF) 100 MCG/2ML IJ SOLN: 50.0000 ug | INTRAMUSCULAR | Status: DC | PRN

## 2018-04-17 MED ORDER — SOD CITRATE-CITRIC ACID 500-334 MG/5ML PO SOLN: 30.0000 mL | ORAL | Status: DC | PRN

## 2018-04-17 MED ORDER — FLEET ENEMA 7-19 GM/118ML RE ENEM: 1.0000 | ENEMA | RECTAL | Status: DC | PRN

## 2018-04-17 MED ORDER — OXYTOCIN BOLUS FROM INFUSION
500.0000 mL | Freq: Once | INTRAVENOUS | Status: AC
  Administered 2018-04-18: 500 mL via INTRAVENOUS

## 2018-04-17 MED ORDER — ONDANSETRON HCL 4 MG/2ML IJ SOLN: 4.0000 mg | Freq: Four times a day (QID) | INTRAMUSCULAR | Status: DC | PRN

## 2018-04-17 MED ORDER — MISOPROSTOL 50MCG HALF TABLET
50.0000 ug | ORAL_TABLET | ORAL | Status: DC
  Administered 2018-04-17: 50 ug via ORAL
  Filled 2018-04-17: qty 1

## 2018-04-17 MED ORDER — OXYTOCIN 40 UNITS IN LACTATED RINGERS INFUSION - SIMPLE MED
2.5000 [IU]/h | INTRAVENOUS | Status: DC
  Administered 2018-04-18: 2.5 [IU]/h via INTRAVENOUS

## 2018-04-17 NOTE — Progress Notes (Signed)
Pt presents for ROB and NST. IOL scheduled for tomorrow.

## 2018-04-17 NOTE — Progress Notes (Signed)
OB/GYN Faculty Practice: Labor Progress Note  Subjective: Doing well, ate dinner and comfortable. Husband in room. Not feeling contractions at this time.   Objective: BP 119/62   Pulse 63   Temp 98.4 F (36.9 C) (Oral)   Resp 16   Ht 5' (1.524 m)   Wt 72.1 kg   LMP 07/12/2017   SpO2 100%   BMI 31.05 kg/m  Gen: comfortable appearing, NAD Dilation: 2 Effacement (%): Thick Station: Ballotable Presentation: Vertex Exam by:: Dr. Marlis EdelsonL Linda Dean   Assessment and Plan: 42 y.o. [phone] 6165w6d here for IOL for NRNST, decreased fetal movement at term.   Labor: Will start induction with cytotec.  -- pain control: comfortable at this time, okay for epidural  -- PPH Risk: low (though has low lying placenta, so continue to reevaluate)   Fetal Well-Being: EFW 8lbs by Leopolds. Cephalic by sutures.  -- Category I - continuous fetal monitoring  -- GBS negative    Linda Weedman S. Earlene PlaterWallace, DO OB/GYN Fellow, Faculty Practice  9:32 PM

## 2018-04-17 NOTE — MAU Note (Signed)
Pt states she's having DFM today. Also feeling like she may be having some leaking fluid once around 10am and then again at 1pm.

## 2018-04-17 NOTE — Progress Notes (Addendum)
Linda RobinsonsMaria De La Micael [name] is a 42 y.o. [phone] at 6065w6d by LMP admitted for induction of labor due to Hypertension.  Subjective: Patient resting comfortably in bed with FOB at bedside and translator in room for visit.  Objective: BP 119/62   Pulse 63   Temp 98.4 F (36.9 C) (Oral)   Resp 16   Ht 5' (1.524 m)   Wt 72.1 kg   LMP 07/12/2017   SpO2 100%   BMI 31.05 kg/m  No intake/output data recorded.    FHT:  FHR: 140 bpm, variability: moderate,  accelerations:  Present,  decelerations:  Absent UC:   irregular, every 5-6 minutes SVE:   Dilation: 2 Exam by:: C Neill CNM  Labs: Lab Results  Component Value Date   WBC 8.8 04/17/2018   HGB 12.4 04/17/2018   HCT 38.1 04/17/2018   MCV 91.8 04/17/2018   PLT 301 04/17/2018    Assessment / Plan: IOL for decreased fetal movement  Labor: Latent, will start pitocin after dinner Preeclampsia:  no signs or symptoms of toxicity Fetal Wellbeing:  Category I Pain Control:  Epidural and Planning epidural I/D:  n/a Anticipated MOD:  NSVD  Bernerd LimboJamilla R Walker, SNM 04/17/2018, 8:52 PM  I confirm that I have verified the information documented in the SNM's note and that I have also personally reperformed the physical exam and all medical decision making activities.  Clayton BiblesSamantha Sharonica Kraszewski, CNM 04/18/18  4:04 AM

## 2018-04-17 NOTE — MAU Note (Signed)
[facility] Waiting on bed assignment.

## 2018-04-17 NOTE — MAU Note (Signed)
Sent over from MD office due to non reactive NST, reports decreased fetal movement

## 2018-04-17 NOTE — H&P (Signed)
OBSTETRIC ADMISSION HISTORY AND PHYSICAL  Linda Dean is a 42 y.o. female [phone] with IUP at 7538w6d by LMP presenting for IOL for decreased fetal movement. She had a non reactive NST in the office today. She reports +FMs, No LOF, no VB, no blurry vision, headaches or peripheral edema, and RUQ pain.  She plans on Bottle feeding. She request pills for birth control. [facility]  Dating: By LMP --->  Estimated Date of Delivery: 04/18/18  Nursing Staff Provider  Office Location  [facility] Dating  LMP  Language  Spanish Anatomy US    Flu Vaccine  Declined 03/13/18 Genetic Screen  NIPS:   AFP:   First Screen:  Quad:    TDaP vaccine  declined 03/13/18 Hgb A1C or  GTT Early  Third trimester   Rhogam  A+   LAB RESULTS   Feeding Plan Bottle Blood Type A/Positive/-- (05/28 1446)   Contraception Pills Antibody Negative (05/28 1446)  Circumcision N/a female Rubella 2.86 (05/28 1446)  Pediatrician  undecided RPR Non Reactive (09/10 1015)   Support Person FOB HBsAg Negative (05/28 1446)   Prenatal Classes no HIV Non Reactive (09/10 1015)  BTL Consent  GBS Negative (10/31 1701)(For PCN allergy, check sensitivities)   VBAC Consent  Pap     Hgb Electro      CF     SMA     Waterbirth  [ ]  Class [ ]  Consent [ ]  CNM visit      Prenatal History/Complications:  Past Medical History: Past Medical History:  Diagnosis Date  . Medical history non-contributory     Past Surgical History: Past Surgical History:  Procedure Laterality Date  . NO PAST SURGERIES      Obstetrical History: OB History    Gravida  4   Para  3   Term  3   Preterm      AB      Living  3     SAB      TAB      Ectopic      Multiple      Live Births  3           Social History: Social History   Socioeconomic History  . Marital status: Married    Spouse name: Not on file  . Number of children: Not on file  . Years of education: Not on file  .  Highest education level: Not on file  Occupational History  . Not on file  Social Needs  . Financial resource strain: Not on file  . Food insecurity:    Worry: Not on file    Inability: Not on file  . Transportation needs:    Medical: Not on file    Non-medical: Not on file  Tobacco Use  . Smoking status: Never Smoker  . Smokeless tobacco: Never Used  Substance and Sexual Activity  . Alcohol use: Not Currently  . Drug use: Not Currently  . Sexual activity: Yes  Lifestyle  . Physical activity:    Days per week: Not on file    Minutes per session: Not on file  . Stress: Not on file  Relationships  . Social connections:    Talks on phone: Not on file    Gets together: Not on file    Attends religious service: Not on file    Active member of club or organization: Not on file    Attends meetings  of clubs or organizations: Not on file    Relationship status: Not on file  Other Topics Concern  . Not on file  Social History Narrative  . Not on file    Family History: No family history on file.  Allergies: No Known Allergies  Medications Prior to Admission  Medication Sig Dispense Refill Last Dose  . Prenatal Vit-Fe Fumarate-FA (PNV FOLIC ACID + IRON) 27-1 MG TABS Take 1 tablet by mouth daily before breakfast. 90 tablet 4 Past Week at Unknown time  . Cholecalciferol (VITAMIN D) 2000 units CAPS Take 1 capsule (2,000 Units total) by mouth daily before breakfast. (Patient not taking: Reported on 04/17/2018) 30 capsule 11 Not Taking at Unknown time  . loratadine (CLARITIN) 10 MG tablet Take 1 tablet (10 mg total) by mouth daily. (Patient not taking: Reported on 03/13/2018) 30 tablet 11 Not Taking at Unknown time  . promethazine (PHENERGAN) 25 MG tablet Take 1 tablet (25 mg total) by mouth every 6 (six) hours as needed for nausea or vomiting. (Patient not taking: Reported on 04/17/2018) 30 tablet 2 Not Taking at Unknown time     Review of Systems   All systems reviewed and  negative except as stated in HPI  Blood pressure 112/68, pulse 66, temperature 98.6 F (37 C), temperature source Oral, resp. rate 15, height 5' (1.524 m), weight 72.1 kg, last menstrual period 07/12/2017, SpO2 100 %. General appearance: alert, cooperative and no distress Lungs: clear to auscultation bilaterally Heart: regular rate and rhythm Abdomen: soft, non-tender; bowel sounds normal Pelvic: n/a Extremities: Homans sign is negative, no sign of DVT DTR's +2 Presentation: cephalic Fetal monitoringBaseline: 120 bpm, Variability: Good {> 6 bpm), Accelerations: Reactive and Decelerations: Absent Uterine activity: occasional uc's Dilation: 2 Exam by:: Ma Hillock CNM   Prenatal labs: ABO, Rh: A/Positive/-- (05/28 1446) Antibody: Negative (05/28 1446) Rubella: 2.86 (05/28 1446) RPR: Non Reactive (09/10 1015)  HBsAg: Negative (05/28 1446)  HIV: Non Reactive (09/10 1015)  GBS: Negative (10/31 1701)   Prenatal Transfer Tool  Maternal Diabetes: No Genetic Screening: Normal Maternal Ultrasounds/Referrals: Normal Fetal Ultrasounds or other Referrals:  None Maternal Substance Abuse:  No Significant Maternal Medications:  None Significant Maternal Lab Results: Lab values include: Group B Strep negative  No results found for this or any previous visit (from the past 24 hour(s)).  Patient Active Problem List   Diagnosis Date Noted  . Complete placenta previa nos or without hemorrhage, second trimester 12/04/2017  . Supervision of high risk pregnancy, antepartum 10/22/2017  . AMA (advanced maternal age) primigravida 35+ 10/22/2017    Assessment/Plan:  Linda Dean is a 42 y.o. [phone] at [redacted]w[redacted]d here for IOL for decreased fetal movement.   #Labor: Plan pitocin for IOL #Pain: Per patient request #FWB: Cat 2 #ID:  GBS neg #MOF: Bottle #MOC: Pills #Circ:  n/a  Rolm Bookbinder, CNM  04/17/2018, 4:44 PM

## 2018-04-17 NOTE — Progress Notes (Signed)
Subjective:  Linda Dean is a 42 y.o. [phone] at 9064w6d being seen today for ongoing prenatal care.  She is currently monitored for the following issues for this high-risk pregnancy and has Supervision of high risk pregnancy, antepartum; AMA (advanced maternal age) primigravida 35+; and Complete placenta previa nos or without hemorrhage, second trimester on their problem list.  Patient reports no complaints.  Contractions: Irregular. Vag. Bleeding: None.  Movement: Present. Denies leaking of fluid.   The following portions of the patient's history were reviewed and updated as appropriate: allergies, current medications, past family history, past medical history, past social history, past surgical history and problem list. Problem list updated.  Objective:   Vitals:   04/17/18 1358  BP: 105/62  Pulse: 72  Weight: 160 lb (72.6 kg)    Fetal Status: Fetal Heart Rate (bpm): NST-NR   Movement: Present     General:  Alert, oriented and cooperative. Patient is in no acute distress.  Skin: Skin is warm and dry. No rash noted.   Cardiovascular: Normal heart rate noted  Respiratory: Normal respiratory effort, no problems with respiration noted  Abdomen: Soft, gravid, appropriate for gestational age. Pain/Pressure: Present     Pelvic:  Cervical exam deferred        Extremities: Normal range of motion.  Edema: Trace  Mental Status: Normal mood and affect. Normal behavior. Normal judgment and thought content.   Urinalysis:      Assessment and Plan:  Pregnancy: G4P3003 at 664w6d  1. Supervision of high risk pregnancy, antepartum  2. AMA (advanced maternal age) multigravida 35+, third trimester Rx: - Fetal nonstress test:  Non Reactive.  Fair variability.  5-10 x 5-10 beat accels.  No decels.  Occasional UC.  Term labor symptoms and general obstetric precautions including but not limited to vaginal bleeding, contractions, leaking of fluid and fetal movement were reviewed in detail  with the patient. Please refer to After Visit Summary for other counseling recommendations.  [facility].  IOL schedule for 04-18-2018.  Return in about 6 weeks (around 05/29/2018) for Postpartum.     Brock BadHarper, Jevon Shells A, MD

## 2018-04-18 ENCOUNTER — Inpatient Hospital Stay (HOSPITAL_COMMUNITY): Payer: Medicaid Other | Admitting: Anesthesiology

## 2018-04-18 ENCOUNTER — Encounter: Payer: Self-pay | Admitting: Obstetrics

## 2018-04-18 ENCOUNTER — Encounter (HOSPITAL_COMMUNITY): Payer: Self-pay | Admitting: Anesthesiology

## 2018-04-18 ENCOUNTER — Inpatient Hospital Stay (HOSPITAL_COMMUNITY): Admission: RE | Admit: 2018-04-18 | Payer: Self-pay | Source: Ambulatory Visit

## 2018-04-18 DIAGNOSIS — Z3A39 39 weeks gestation of pregnancy: Secondary | ICD-10-CM

## 2018-04-18 LAB — RPR: RPR Ser Ql: NONREACTIVE

## 2018-04-18 MED ORDER — SIMETHICONE 80 MG PO CHEW: 80.0000 mg | CHEWABLE_TABLET | ORAL | Status: DC | PRN

## 2018-04-18 MED ORDER — SENNOSIDES-DOCUSATE SODIUM 8.6-50 MG PO TABS
2.0000 | ORAL_TABLET | ORAL | Status: DC
  Administered 2018-04-19 – 2018-04-20 (×2): 2 via ORAL
  Filled 2018-04-18 (×2): qty 2

## 2018-04-18 MED ORDER — SENNOSIDES-DOCUSATE SODIUM 8.6-50 MG PO TABS: 2.0000 | ORAL_TABLET | ORAL | Status: DC

## 2018-04-18 MED ORDER — EPHEDRINE 5 MG/ML INJ
10.0000 mg | INTRAVENOUS | Status: DC | PRN
  Filled 2018-04-18: qty 2

## 2018-04-18 MED ORDER — TETANUS-DIPHTH-ACELL PERTUSSIS 5-2.5-18.5 LF-MCG/0.5 IM SUSP
0.5000 mL | Freq: Once | INTRAMUSCULAR | Status: AC
  Administered 2018-04-19: 0.5 mL via INTRAMUSCULAR
  Filled 2018-04-18: qty 0.5

## 2018-04-18 MED ORDER — ZOLPIDEM TARTRATE 5 MG PO TABS: 5.0000 mg | ORAL_TABLET | Freq: Every evening | ORAL | Status: DC | PRN

## 2018-04-18 MED ORDER — WITCH HAZEL-GLYCERIN EX PADS: 1.0000 "application " | MEDICATED_PAD | CUTANEOUS | Status: DC | PRN

## 2018-04-18 MED ORDER — BENZOCAINE-MENTHOL 20-0.5 % EX AERO: 1.0000 "application " | INHALATION_SPRAY | CUTANEOUS | Status: DC | PRN

## 2018-04-18 MED ORDER — COCONUT OIL OIL: 1.0000 "application " | TOPICAL_OIL | Status: DC | PRN

## 2018-04-18 MED ORDER — LIDOCAINE HCL (PF) 1 % IJ SOLN
INTRAMUSCULAR | Status: DC | PRN
  Administered 2018-04-18 (×2): 5 mL via EPIDURAL

## 2018-04-18 MED ORDER — DIBUCAINE 1 % RE OINT: 1.0000 "application " | TOPICAL_OINTMENT | RECTAL | Status: DC | PRN

## 2018-04-18 MED ORDER — DIPHENHYDRAMINE HCL 50 MG/ML IJ SOLN: 12.5000 mg | INTRAMUSCULAR | Status: DC | PRN

## 2018-04-18 MED ORDER — DIPHENHYDRAMINE HCL 25 MG PO CAPS: 25.0000 mg | ORAL_CAPSULE | Freq: Four times a day (QID) | ORAL | Status: DC | PRN

## 2018-04-18 MED ORDER — PRENATAL MULTIVITAMIN CH
1.0000 | ORAL_TABLET | Freq: Every day | ORAL | Status: DC
  Administered 2018-04-19 – 2018-04-20 (×2): 1 via ORAL
  Filled 2018-04-18 (×2): qty 1

## 2018-04-18 MED ORDER — PHENYLEPHRINE 40 MCG/ML (10ML) SYRINGE FOR IV PUSH (FOR BLOOD PRESSURE SUPPORT)
80.0000 ug | PREFILLED_SYRINGE | INTRAVENOUS | Status: DC | PRN
  Filled 2018-04-18: qty 10
  Filled 2018-04-18: qty 5

## 2018-04-18 MED ORDER — ACETAMINOPHEN 325 MG PO TABS: 650.0000 mg | ORAL_TABLET | ORAL | Status: DC | PRN

## 2018-04-18 MED ORDER — IBUPROFEN 600 MG PO TABS
600.0000 mg | ORAL_TABLET | Freq: Four times a day (QID) | ORAL | Status: DC
  Administered 2018-04-18 – 2018-04-20 (×8): 600 mg via ORAL
  Filled 2018-04-18 (×8): qty 1

## 2018-04-18 MED ORDER — TETANUS-DIPHTH-ACELL PERTUSSIS 5-2.5-18.5 LF-MCG/0.5 IM SUSP: 0.5000 mL | Freq: Once | INTRAMUSCULAR | Status: DC

## 2018-04-18 MED ORDER — PHENYLEPHRINE 40 MCG/ML (10ML) SYRINGE FOR IV PUSH (FOR BLOOD PRESSURE SUPPORT)
80.0000 ug | PREFILLED_SYRINGE | INTRAVENOUS | Status: DC | PRN
  Filled 2018-04-18: qty 5

## 2018-04-18 MED ORDER — BENZOCAINE-MENTHOL 20-0.5 % EX AERO
1.0000 "application " | INHALATION_SPRAY | CUTANEOUS | Status: DC | PRN
  Administered 2018-04-19: 1 via TOPICAL
  Filled 2018-04-18: qty 56

## 2018-04-18 MED ORDER — ONDANSETRON HCL 4 MG PO TABS: 4.0000 mg | ORAL_TABLET | ORAL | Status: DC | PRN

## 2018-04-18 MED ORDER — ONDANSETRON HCL 4 MG/2ML IJ SOLN: 4.0000 mg | INTRAMUSCULAR | Status: DC | PRN

## 2018-04-18 MED ORDER — FENTANYL 2.5 MCG/ML BUPIVACAINE 1/10 % EPIDURAL INFUSION (WH - ANES)
14.0000 mL/h | INTRAMUSCULAR | Status: DC | PRN
  Administered 2018-04-18: 14 mL/h via EPIDURAL
  Filled 2018-04-18: qty 100

## 2018-04-18 MED ORDER — LACTATED RINGERS IV SOLN
500.0000 mL | Freq: Once | INTRAVENOUS | Status: AC
  Administered 2018-04-18: 500 mL via INTRAVENOUS

## 2018-04-18 MED ORDER — ACETAMINOPHEN 325 MG PO TABS
650.0000 mg | ORAL_TABLET | ORAL | Status: DC | PRN
  Administered 2018-04-18: 650 mg via ORAL
  Filled 2018-04-18: qty 2

## 2018-04-18 MED ORDER — IBUPROFEN 600 MG PO TABS: 600.0000 mg | ORAL_TABLET | Freq: Four times a day (QID) | ORAL | Status: DC

## 2018-04-18 MED ORDER — PRENATAL MULTIVITAMIN CH: 1.0000 | ORAL_TABLET | Freq: Every day | ORAL | Status: DC

## 2018-04-18 NOTE — Progress Notes (Signed)
OB/GYN Faculty Practice: Labor Progress Note  Subjective: Spoke with RN regarding plan of care. Patient sleeping, feeling occasional contractions.   Objective: BP 113/66   Pulse 64   Temp 98.2 F (36.8 C) (Oral)   Resp 18   Ht 5' (1.524 m)   Wt 72.1 kg   LMP 07/12/2017   SpO2 100%   BMI 31.05 kg/m  Gen: sleeping Dilation: 3.5 Effacement (%): 50 Cervical Position: Posterior Station: -2 Presentation: Vertex Exam by:: Irving BurtonEmily Rothermel RN   Assessment and Plan: 42 y.o. [id number] 7537w6d here for IOL for NRNST, decreased fetal movement at term.   Labor: Induction started with cytotec with changed noted. -- cytotec x 2  -- pain control: comfortable at this time, okay for epidural  -- PPH Risk: low (though has low lying placenta, so continue to reevaluate)   Fetal Well-Being: EFW 8lbs by Leopolds. Cephalic by sutures.  -- Category I - continuous fetal monitoring  -- GBS negative    Lucrezia Dehne S. Earlene PlaterWallace, DO OB/GYN Fellow, Faculty Practice  1:35 AM

## 2018-04-18 NOTE — Progress Notes (Signed)
Interpretor at bedside. 

## 2018-04-18 NOTE — Lactation Note (Signed)
This note was copied from a baby's chart. Lactation Consultation Note  Patient Name: Linda Dean NFAOZ'HToday's Date: 04/18/2018 Reason for consult: Initial assessment;Term  69 hours old FT female who is being exclusively BF by her mother, she's a P4 and experienced BF. She was able to BF her other children for 12 months and did both, breast and formula, that was also her feeding choice upon admission for this baby. [facility], she's already familiar with hand expression.   [facility], colostrum was easily obtained out of the left breast, and rubbed it on baby's mouth, she was asleep. She doesn't have a pump at home, [facility]; instructions, cleaning and storage were reviewed as well as milk storage guidelines.   Offered assistance with latch but mom politely declined stating that she didn't want to wake baby up at this time. Asked mom to call for assistance when needed. Per mom feedings at the breast are comfortable and she's able to hear baby swallowing. Discussed cluster feeding, SIDS (baby was wearing a thick sleeper and a blanket) and safe sleep.  Feeding plan   1. Encouraged mom to feed baby STS 8-12 times/24 hours or sooner if feeding cues are present 2. Hand expression and spoon feeding was also encouraged  BF brochure (SP), BF resources and feeding diary (SP) were reviewed. Parents reported all questions and concerns were answered, they're both aware of LC services and will call PRN.  Maternal Data Formula Feeding for Exclusion: Yes Reason for exclusion: Mother's choice to formula and breast feed on admission Has patient been taught Hand Expression?: Yes Does the patient have breastfeeding experience prior to this delivery?: Yes  Feeding Feeding Type: Breast Fed  LATCH Score Latch: Repeated attempts needed to sustain latch, nipple held in mouth throughout feeding, stimulation  needed to elicit sucking reflex.  Audible Swallowing: A few with stimulation  Type of Nipple: Everted at rest and after stimulation  Comfort (Breast/Nipple): Soft / non-tender  Hold (Positioning): Assistance needed to correctly position infant at breast and maintain latch.  LATCH Score: 7  Interventions Interventions: Breast feeding basics reviewed;Breast massage;Hand express;Breast compression;Hand pump  Lactation Tools Discussed/Used Tools: Pump Breast pump type: Manual WIC Program: Yes Pump Review: Setup, frequency, and cleaning;Milk Storage Initiated by:: MPeck Date initiated:: 04/18/18   Consult Status Consult Status: Follow-up Date: 04/19/18 Follow-up type: In-patient    Reginal Wojcicki Venetia ConstableS Shaneequa Bahner 04/18/2018, 11:01 PM

## 2018-04-18 NOTE — Anesthesia Pain Management Evaluation Note (Signed)
  CRNA Pain Management Visit Note  Patient: Linda LopesMaria De La [name], 42 y.o., female  "Hello I am a member of the anesthesia team at [facility]. We have an anesthesia team available at all times to provide care throughout the hospital, including epidural management and anesthesia for C-section. I don't know your plan for the delivery whether it a natural birth, water birth, IV sedation, nitrous supplementation, doula or epidural, but we want to meet your pain goals."   1.Was your pain managed to your expectations on prior hospitalizations?   Yes   2.What is your expectation for pain management during this hospitalization?     Epidural  3.How can we help you reach that goal?   Record the patient's initial score and the patient's pain goal.   Pain: 0  Pain Goal: 5 [facility] wants you to be able to say your pain was always managed very well.  Laban EmperorMalinova,[name] 04/18/2018

## 2018-04-18 NOTE — Progress Notes (Signed)
OB/GYN Faculty Practice: Labor Progress Note  Subjective: Starting to feel some contractions, was able to sleep for a little while.  Objective: BP 115/67   Pulse 74   Temp 97.6 F (36.4 C) (Oral)   Resp 16   Ht 5' (1.524 m)   Wt 72.1 kg   LMP 07/12/2017   SpO2 100%   BMI 31.05 kg/m  Gen: comfortable appearing, NAD Dilation: 4 Effacement (%): 50 Cervical Position: Posterior Station: -2 Presentation: Vertex Exam by:: L Keisean Skowron  Assessment and Plan: 42 y.o. [id number] 5965w6d here for IOL for NRNST, decreased fetal movement at term.   Labor: Induction started with cytotec -- cytotec x 1 - 2nd dose not given because of contraction frequency -- switch to pitocin, consider AROM at next check  -- pain control: comfortable at this time, okay for epidural  -- PPH Risk: low (though has low lying placenta, so continue to reevaluate)   Fetal Well-Being: EFW 8lbs by Leopolds. Cephalic by sutures.  -- Category I - continuous fetal monitoring  -- GBS negative    Linda Altieri S. Earlene PlaterWallace, DO OB/GYN Fellow, Faculty Practice  5:11 AM

## 2018-04-18 NOTE — Progress Notes (Signed)
Interpreter at bedside.

## 2018-04-18 NOTE — Anesthesia Preprocedure Evaluation (Signed)
Anesthesia Evaluation  Patient identified by MRN, date of birth, ID band Patient awake    Reviewed: Allergy & Precautions, H&P , NPO status , Patient's Chart, lab work & pertinent test results  Airway Mallampati: I  TM Distance: >3 FB Neck ROM: full    Dental no notable dental hx. (+) Teeth Intact   Pulmonary neg pulmonary ROS,    Pulmonary exam normal breath sounds clear to auscultation       Cardiovascular negative cardio ROS Normal cardiovascular exam Rhythm:regular Rate:Normal     Neuro/Psych negative neurological ROS  negative psych ROS   GI/Hepatic negative GI ROS, Neg liver ROS,   Endo/Other  negative endocrine ROS  Renal/GU negative Renal ROS     Musculoskeletal   Abdominal Normal abdominal exam  (+)   Peds  Hematology negative hematology ROS (+)   Anesthesia Other Findings   Reproductive/Obstetrics (+) Pregnancy                            Anesthesia Physical Anesthesia Plan  ASA: II  Anesthesia Plan: Epidural   Post-op Pain Management:    Induction:   PONV Risk Score and Plan:   Airway Management Planned:   Additional Equipment:   Intra-op Plan:   Post-operative Plan:   Informed Consent: I have reviewed the patients History and Physical, chart, labs and discussed the procedure including the risks, benefits and alternatives for the proposed anesthesia with the patient or authorized representative who has indicated his/her understanding and acceptance.     Plan Discussed with:   Anesthesia Plan Comments:         Anesthesia Quick Evaluation  

## 2018-04-18 NOTE — Anesthesia Procedure Notes (Signed)
Epidural Patient location during procedure: OB Start time: 04/18/2018 9:04 AM End time: 04/18/2018 9:08 AM  Staffing Anesthesiologist: Leilani AbleHatchett, Thayden Lemire, MD Performed: anesthesiologist   Preanesthetic Checklist Completed: patient identified, site marked, surgical consent, pre-op evaluation, timeout performed, IV checked, risks and benefits discussed and monitors and equipment checked  Epidural Patient position: sitting Prep: site prepped and draped and DuraPrep Patient monitoring: continuous pulse ox and blood pressure Approach: midline Location: L3-L4 Injection technique: LOR air  Needle:  Needle type: Tuohy  Needle gauge: 17 G Needle length: 9 cm and 9 Needle insertion depth: 5 cm cm Catheter type: closed end flexible Catheter size: 19 Gauge Catheter at skin depth: 10 cm Test dose: negative and Other  Assessment Events: blood not aspirated, injection not painful, no injection resistance, negative IV test and no paresthesia  Additional Notes Reason for block:procedure for pain

## 2018-04-18 NOTE — Progress Notes (Signed)
LABOR PROGRESS NOTE  Linda Dean is a 42 y.o. [phone] at 6129w0d  admitted for IOL for decreased fetal movement.   Subjective: Comfortable with her epidural. No concerns at present.   Objective: BP 114/68   Pulse 72   Temp 97.9 F (36.6 C) (Oral)   Resp 16   Ht 5' (1.524 m)   Wt 72.1 kg   LMP 07/12/2017   SpO2 100%   BMI 31.05 kg/m  or  Vitals:   04/18/18 0945 04/18/18 1000 04/18/18 1030 04/18/18 1048  BP: 121/69 122/74 114/68   Pulse: 66 68 72   Resp: 16 16 16    Temp:    97.9 F (36.6 C)  TempSrc:    Oral  SpO2:      Weight:      Height:        Dilation: 6 Effacement (%): 70 Cervical Position: Posterior Station: 0 Presentation: Vertex Exam by:: Earlene PlaterWallace, C FHT: baseline rate 130, moderate varibility, +acel, no decel Toco: q1-3 min   Labs: Lab Results  Component Value Date   WBC 8.8 04/17/2018   HGB 12.4 04/17/2018   HCT 38.1 04/17/2018   MCV 91.8 04/17/2018   PLT 301 04/17/2018    Patient Active Problem List   Diagnosis Date Noted  . AMA (advanced maternal age) multigravida 35+ 04/17/2018  . Complete placenta previa nos or without hemorrhage, second trimester 12/04/2017  . Supervision of high risk pregnancy, antepartum 10/22/2017  . AMA (advanced maternal age) primigravida 35+ 10/22/2017    Assessment / Plan: 42 y.o. G4P3003 at 7129w0d here for IOL for decreased fetal movement.   Labor: Induction. S/p cytotec x1. On Pitocin at 10 mu/min. AROM at 1045 with clear fluid.  Fetal Wellbeing:  Cat I  Pain Control:  Epidural in place.  Anticipated MOD:  NSVD   Marcy Sirenatherine Saige Busby, D.O. OB Fellow  04/18/2018, 11:03 AM

## 2018-04-19 NOTE — Lactation Note (Signed)
This note was copied from a baby's chart. Lactation Consultation Note  Patient Name: Linda Stanford BreedMaria Dean ZOXWR'UToday's Date: 04/19/2018 Reason for consult: Follow-up assessment   P4, baby 2731 hours old.  FOB states mother prefers to breastfeed and give formula after to help baby sleep. Reviewed hand expression with mother with good flow of colostrum before latching. Baby latched in cradle hold with intermittent swallows. Mom encouraged to feed baby 8-12 times/24 hours and with feeding cues.  No questions or concerns.   Maternal Data    Feeding Feeding Type: Breast Fed  LATCH Score Latch: Grasps breast easily, tongue down, lips flanged, rhythmical sucking.  Audible Swallowing: A few with stimulation  Type of Nipple: Everted at rest and after stimulation  Comfort (Breast/Nipple): Soft / non-tender  Hold (Positioning): Assistance needed to correctly position infant at breast and maintain latch.  LATCH Score: 8  Interventions Interventions: Breast feeding basics reviewed;Hand express  Lactation Tools Discussed/Used     Consult Status Consult Status: Follow-up Date: 04/20/18 Follow-up type: In-patient    Dahlia ByesBerkelhammer, Ruth [facility] 04/19/2018, 9:23 PM

## 2018-04-19 NOTE — Progress Notes (Signed)
Pediatrician used Spanish interpreter to discuss infant care, discharge, follow-up, and to answer questions.  RN discussed plan for day, verified pt wants TdaP and does not have pain, informed pt re: baby's newborn screen and congenital heart disease screening, and to ask if pt has questions.  Delice BisonBettie Oberia Beaudoin RCharity fundraiser

## 2018-04-19 NOTE — Progress Notes (Signed)
Faculty Attending Note  Post Partum Day 1  Subjective: Patient is feeling well. She reports moderately well controlled pain on PO pain meds. She is ambulating and reports very mild light-headedness. She is passing flatus. She is tolerating a regular diet without nausea/vomiting. Bleeding is slightly heavier than a period, bright red blood. She is breast feeding. Baby is in nursery and doing well.  Objective: Blood pressure (!) 90/52, pulse 70, temperature 97.7 F (36.5 C), temperature source Oral, resp. rate 16, height 5' (1.524 m), weight 72.1 kg, last menstrual period 07/12/2017, SpO2 98 %, unknown if currently breastfeeding. Temp:  [97.7 F (36.5 C)-98.5 F (36.9 C)] 97.7 F (36.5 C) (11/28 0536) Pulse Rate:  [67-128] 70 (11/28 0536) Resp:  [16-18] 16 (11/28 0536) BP: (90-141)/(42-93) 90/52 (11/28 0536) SpO2:  [98 %-100 %] 98 % (11/28 0536)  Physical Exam:  General: alert, oriented, cooperative Chest: CTAB, normal respiratory effort Heart: RRR  Abdomen: +BS, soft, appropriately tender to palpation  Uterine Fundus: firm, 2 fingers below the umbilicus Lochia: moderate, rubra DVT Evaluation: no evidence of DVT Extremities: no edema, no calf tenderness   Current Facility-Administered Medications:  .  acetaminophen (TYLENOL) tablet 650 mg, 650 mg, Oral, Q4H PRN, Aviva SignsWilliams, Marie L, CNM, 650 mg at 04/18/18 2052 .  benzocaine-Menthol (DERMOPLAST) 20-0.5 % topical spray 1 application, 1 application, Topical, PRN, Aviva SignsWilliams, Marie L, CNM, 1 application at 04/19/18 [id number] .  coconut oil, 1 application, Topical, PRN, Aviva SignsWilliams, Marie L, CNM .  witch hazel-glycerin (TUCKS) pad 1 application, 1 application, Topical, PRN **AND** dibucaine (NUPERCAINAL) 1 % rectal ointment 1 application, 1 application, Rectal, PRN, Aviva SignsWilliams, Marie L, CNM .  diphenhydrAMINE (BENADRYL) capsule 25 mg, 25 mg, Oral, Q6H PRN, Aviva SignsWilliams, Marie L, CNM .  ibuprofen (ADVIL,MOTRIN) tablet 600 mg, 600 mg, Oral, Q6H, Aviva SignsWilliams,  Marie L, CNM, 600 mg at 04/19/18 1208 .  ondansetron (ZOFRAN) tablet 4 mg, 4 mg, Oral, Q4H PRN **OR** ondansetron (ZOFRAN) injection 4 mg, 4 mg, Intravenous, Q4H PRN, Aviva SignsWilliams, Marie L, CNM .  prenatal multivitamin tablet 1 tablet, 1 tablet, Oral, Q1200, Aviva SignsWilliams, Marie L, CNM, 1 tablet at 04/19/18 1208 .  senna-docusate (Senokot-S) tablet 2 tablet, 2 tablet, Oral, Q24H, Aviva SignsWilliams, Marie L, CNM, 2 tablet at 04/19/18 0009 .  simethicone (MYLICON) chewable tablet 80 mg, 80 mg, Oral, PRN, Aviva SignsWilliams, Marie L, CNM .  Tdap (BOOSTRIX) injection 0.5 mL, 0.5 mL, Intramuscular, Once, Aviva SignsWilliams, Marie L, CNM .  zolpidem (AMBIEN) tablet 5 mg, 5 mg, Oral, QHS PRN, Aviva SignsWilliams, Marie L, CNM Recent Labs    04/17/18 1813  HGB 12.4  HCT 38.1    Assessment/Plan:  Patient is 42 y.o. [id number] PPD#1 s/p SVD at 5445w0d. She is doing very well, recovering appropriately and complains only of minor pain.    Continue routine post partum care Pain meds prn Regular diet POP for birth control Plan for discharge tomorrow   Spanish translator used   Baldemar LenisK. Meryl Trevel Dillenbeck, M.D. Attending Center [facility] (Faculty Practice)  04/19/2018, 12:11 PM

## 2018-04-19 NOTE — Anesthesia Postprocedure Evaluation (Signed)
Anesthesia Post Note  Patient: Georgia LopesMaria De La Luz Ong  Procedure(s) Performed: AN AD HOC LABOR EPIDURAL     Patient location during evaluation: Mother Baby Anesthesia Type: Epidural Level of consciousness: awake and alert Pain management: pain level controlled Vital Signs Assessment: post-procedure vital signs reviewed and stable Respiratory status: spontaneous breathing, nonlabored ventilation and respiratory function stable Cardiovascular status: stable Postop Assessment: no headache, no backache and epidural receding Anesthetic complications: no    Last Vitals:  Vitals:   04/18/18 2300 04/19/18 0536  BP: 95/60 (!) 90/52  Pulse: 74 70  Resp: 16 16  Temp: 36.6 C 36.5 C  SpO2:  98%    Last Pain:  Vitals:   04/19/18 0536  TempSrc: Oral  PainSc: 0-No pain   Pain Goal: Patients Stated Pain Goal: 2 (04/18/18 0730)               Fanny DanceMULLINS,Caedin Mogan

## 2018-04-20 DIAGNOSIS — Z789 Other specified health status: Secondary | ICD-10-CM

## 2018-04-20 MED ORDER — IBUPROFEN 600 MG PO TABS: 600.0000 mg | ORAL_TABLET | Freq: Four times a day (QID) | ORAL | 0 refills | Status: DC

## 2018-04-20 NOTE — Progress Notes (Signed)
Discharge instructions given using video interpreter Mikle BosworthCarlos [phone]#[id number]

## 2018-04-20 NOTE — Lactation Note (Signed)
This note was copied from a baby's chart. Lactation Consultation Note: experienced BF mom though last baby is 42 years old. Reports baby is latching well with no pain. Reports breasts are feeling a little heavier this morning. Asking about WIC- wants to make appointment with them. Has pump for home. No further questions at present. Reviewed our phone number, OP appointments and BFSG as resources for support after DC. To call prn  Patient Name: Linda Stanford BreedMaria Bialas ZHYQM'VToday's Date: 04/20/2018 Reason for consult: Follow-up assessment   Maternal Data Formula Feeding for Exclusion: Yes Reason for exclusion: Mother's choice to formula and breast feed on admission Has patient been taught Hand Expression?: Yes Does the patient have breastfeeding experience prior to this delivery?: Yes  Feeding Feeding Type: Breast Fed  LATCH Score                   Interventions    Lactation Tools Discussed/Used WIC Program: Yes   Consult Status Consult Status: Complete    Pamelia HoitWeeks, Britten Parady D 04/20/2018, 10:57 AM

## 2018-04-20 NOTE — Discharge Summary (Signed)
Obstetrics Discharge Summary OB/GYN Faculty Practice   Patient Name: Linda Dean DOB: Mar 05, 1976 MRN: [id number]  Date of admission: 04/17/2018 Delivering MD: Myles Lipps   Date of discharge: 04/20/2018  Admitting diagnosis: 23 WKS, NST Intrauterine pregnancy: [redacted]w[redacted]d     Secondary diagnosis:   Active Problems:   Complete placenta previa nos or without hemorrhage, second trimester - resolved   AMA (advanced maternal age) multigravida 42+   Discharge diagnosis: Term Pregnancy Delivered                                            Postpartum procedures: None  Complications: shoulder dystocia - resolved with McRoberts, posterior arm (30-40 seconds)  Outpatient Follow-Up: [ ]  rx cOCPs for contraception at postpartum visit  Hospital course: Linda Dean is a 42 y.o. [redacted]w[redacted]d who was admitted for IOL for decreased fetal movement at term. Her pregnancy was complicated by AMA. Her labor course was notable for induction with cytotec, pitocin, AROM. Delivery was complicated by shoulder dystocia - resolved with McRoberts, delivery of posterior arm. Please see delivery/op note for additional details. Her postpartum course was uncomplicated. She was breastfeeding without difficulty. By day of discharge, she was passing flatus, urinating, eating and drinking without difficulty. Her pain was well-controlled, and she was discharged home with ibuprofen. She will follow-up in clinic in 4-6 weeks.   Physical exam  Vitals:   04/19/18 0536 04/19/18 1454 04/19/18 2206 04/20/18 0555  BP: (!) 90/52 110/61 112/62 112/70  Pulse: 70 79 75 73  Resp: 16 18 16 18   Temp: 97.7 F (36.5 C) 98.9 F (37.2 C) 97.9 F (36.6 C) 98 F (36.7 C)  TempSrc: Oral Oral Oral Oral  SpO2: 98%  98%   Weight:      Height:       General: well-appearing, NAD Lochia: appropriate Uterine Fundus: firm Incision: N/A DVT Evaluation: No significant calf/ankle edema. Labs: Lab Results  Component Value  Date   WBC 8.8 04/17/2018   HGB 12.4 04/17/2018   HCT 38.1 04/17/2018   MCV 91.8 04/17/2018   PLT 301 04/17/2018   CMP Latest Ref Rng & Units 04/09/2009  Glucose 70 - 99 mg/dL 409(W)  BUN 6 - 23 mg/dL 11  Creatinine 0.4 - 1.2 mg/dL 1.19  Sodium 147 - 829 mEq/L 135  Potassium 3.5 - 5.1 mEq/L 3.1(L)  Chloride 96 - 112 mEq/L 99  CO2 19 - 32 mEq/L 25  Calcium 8.4 - 10.5 mg/dL 8.5    Discharge instructions: Per After Visit Summary and "Baby and Me Booklet"  After visit meds:  Allergies as of 04/20/2018   No Known Allergies     Medication List    STOP taking these medications   loratadine 10 MG tablet Commonly known as:  CLARITIN   promethazine 25 MG tablet Commonly known as:  PHENERGAN   Vitamin D 50 MCG (2000 UT) Caps     TAKE these medications   ibuprofen 600 MG tablet Commonly known as:  ADVIL,MOTRIN Take 1 tablet (600 mg total) by mouth every 6 (six) hours.   PNV FOLIC ACID + IRON 27-1 MG Tabs Take 1 tablet by mouth daily before breakfast.      Postpartum contraception: Combination OCPs Diet: Routine Diet Activity: Advance as tolerated. Pelvic rest for 6 weeks.   Follow-up Appt: Message sent to schedule postpartum  visit  Newborn Data: Live born female  Birth Weight: 6 lb 15.8 oz (3170 g) APGAR: 8, 9  Newborn Delivery   Birth date/time:  04/18/2018 13:29:00 Delivery type:  Vaginal, Spontaneous    Baby Feeding: both Disposition:home with mother  Cristal DeerLaurel S. Earlene PlaterWallace, DO OB/GYN Fellow, Faculty Practice

## 2018-05-17 ENCOUNTER — Ambulatory Visit (INDEPENDENT_AMBULATORY_CARE_PROVIDER_SITE_OTHER): Payer: Self-pay | Admitting: Obstetrics

## 2018-05-17 ENCOUNTER — Encounter: Payer: Self-pay | Admitting: Obstetrics

## 2018-05-17 DIAGNOSIS — Z3009 Encounter for other general counseling and advice on contraception: Secondary | ICD-10-CM

## 2018-05-17 DIAGNOSIS — Z3202 Encounter for pregnancy test, result negative: Secondary | ICD-10-CM

## 2018-05-17 DIAGNOSIS — Z30011 Encounter for initial prescription of contraceptive pills: Secondary | ICD-10-CM

## 2018-05-17 DIAGNOSIS — K5901 Slow transit constipation: Secondary | ICD-10-CM

## 2018-05-17 LAB — POCT URINE PREGNANCY: Preg Test, Ur: NEGATIVE

## 2018-05-17 MED ORDER — NORETHINDRONE 0.35 MG PO TABS: 1.0000 | ORAL_TABLET | Freq: Every day | ORAL | 11 refills | Status: DC

## 2018-05-17 MED ORDER — PNV FOLIC ACID + IRON 27-1 MG PO TABS: 1.0000 | ORAL_TABLET | Freq: Every day | ORAL | 11 refills | Status: DC

## 2018-05-17 MED ORDER — DOCUSATE SODIUM 100 MG PO CAPS: 100.0000 mg | ORAL_CAPSULE | Freq: Two times a day (BID) | ORAL | 11 refills | Status: AC

## 2018-05-17 NOTE — Progress Notes (Signed)
Post Partum Exam  Linda Dean is a 42 y.o. [phone] female who presents for a postpartum visit. She is 4 weeks postpartum following a spontaneous vaginal delivery. I have fully reviewed the prenatal and intrapartum course. The delivery was at 40 gestational weeks.  Anesthesia: epidural. Postpartum course has been well. Baby's course has been well. Baby is feeding by bottle - Enfimil . Bleeding no bleeding. Bowel function is Constipation . Bladder function is normal. Patient is not sexually active. Contraception method is none. Postpartum depression screening:neg  The following portions of the patient's history were reviewed and updated as appropriate: allergies, current medications, past family history, past medical history, past social history, past surgical history and problem list. Last pap smear done 10/17/17 and was Normal  Review of Systems A comprehensive review of systems was negative.    Objective:  unknown if currently breastfeeding.  General:  alert and no distress   Breasts:  inspection negative, no nipple discharge or bleeding, no masses or nodularity palpable  Lungs: clear to auscultation bilaterally  Heart:  regular rate and rhythm, S1, S2 normal, no murmur, click, rub or gallop  Abdomen: soft, non-tender; bowel sounds normal; no masses,  no organomegaly   Assessment:    1. Postpartum care following vaginal delivery  2. Encounter for other general counseling and advice on contraception - wants Micronor.  Breast feeding.  3. Encounter for initial prescription of contraceptive pills Rx: - POCT urine pregnancy - norethindrone (MICRONOR,CAMILA,ERRIN) 0.35 MG tablet; Take 1 tablet (0.35 mg total) by mouth daily.  Dispense: 1 Package; Refill: 11  4. Postpartum care and examination of lactating mother Rx: - Prenatal Vit-Fe Fumarate-FA (PNV FOLIC ACID + IRON) 27-1 MG TABS; Take 1 tablet by mouth daily before breakfast.  Dispense: 30 tablet; Refill: 11  5.  Constipation by delayed colonic transit Rx - docusate sodium (COLACE) 100 MG capsule; Take 1 AV:WUJWJca:psule (100 mg total) by mouth 2 (two) times daily.  Dispense: 60 capsule; Refill: 11  Plan:   1. Contraception: oral progesterone-only contraceptive 2. Continue PNV's 3. Follow up in: 6 months or as needed.    Brock BadHARLES A. HARPER MD 05-17-2018

## 2018-05-17 NOTE — Patient Instructions (Signed)
Estreimiento crnico  Chronic Constipation    El estreimiento crnico es una afeccin de las personas que tienen tres o menos deposiciones por semana, durante un perodo de tres meses o ms. Esta afeccin es especialmente frecuente entre los adultos mayores.  Los dos tipos principales de estreimiento crnico son el estreimiento secundario y el estreimiento funcional. El estreimiento secundario deriva de otra afeccin o de un tratamiento. El estreimiento funcional, tambin llamado estreimiento primario o idioptico, se divide en tres tipos:   Estreimiento con trnsito normal. Cuando se presenta este tipo, el movimiento de las heces por el colon (trnsito de las heces) ocurre normalmente.   Estreimiento con trnsito lento. Cuando ocurre este tipo, las heces se mueven lentamente por el colon.   Estreimiento por anismo o disfuncin del suelo plvico. En este tipo, los nervios y msculos que vacan el recto no funcionan normalmente.  Cules son las causas?  Entre las causas del estreimiento secundario pueden incluirse las siguientes:   No beber suficiente cantidad de lquido, no comer suficientes alimentos o fibra, o no hacer actividad fsica.   Embarazo.   Un desgarro en el ano (fisura anal).   Bloqueo del intestino (obstruccin intestinal).   Estrechamiento del intestino (estenosis intestinal).   Tener una afeccin a largo plazo, como:  ? Diabetes.  ? Hipotiroidismo.  ? Esclerosis mltiple.  ? Enfermedad de Parkinson.  ? Accidente cerebrovascular.  ? Lesin en la mdula espinal.  ? Demencia.  ? Cncer de colon.  ? Enfermedad inflamatoria del intestino (EII).  ? Anemia por deficiencia de hierro.  ? Colapso hacia fuera del recto (prolapso rectal).  ? Hemorroides.   Tomar ciertos medicamentos, como:  ? Narcticos. Estos son un cierto tipo de analgsicos recetados.  ? Anticidos.  ? Suplementos de hierro.  ? Medicamentos para eliminar lquidos (diurticos).  ? Ciertos medicamentos para la presin  arterial.  ? Anticonvulsivos.  ? Antidepresivos.  ? Medicamentos para la enfermedad de Parkinson.  La causa del estreimiento funcional es desconocida; sin embargo, se le asocian ciertas afecciones. Estas afecciones incluyen:   Estrs.   Problemas en los nervios y msculos que controlan el trnsito de las heces.   Debilidad o deterioro de los msculos del suelo plvico.  Qu incrementa el riesgo?  Puede tener un riesgo ms alto de estreimiento crnico si:   Es mayor de 70 aos.   Es mujer.   Vive en un centro de atencin a largo plazo.   No realiza mucho ejercicio o actividad fsica (tiene un estilo de vida sedentario).   No bebe la cantidad suficiente de lquidos.   No consume la cantidad suficiente de alimentos, en especial, que contengan fibra.   Tiene una enfermedad a largo plazo.   Tiene un trastorno de salud mental o un trastorno alimenticio.   Toma muchos medicamentos.  Cules son los signos o los sntomas?  El principal sntoma del estreimiento crnico es tener tres o menos deposiciones por semana durante el transcurso de varias semanas. Otros signos y sntomas pueden variar entre una persona y otra. Estos incluyen:   Hacer mucha fuerza (esfuerzo) para defecar.   Tener dolor al defecar.   Tener heces duras o grumosas.   Tener molestia en la parte baja del vientre, como clicos o meteorismo.   No poder defecar cuando se siente la necesidad.   Seguir sintiendo ganas de defecar despus de tener una deposicin.   Sentir que tiene algo en el recto que obstruye o impide las deposiciones.   Notar   sangre en el papel higinico o en las heces.   Agravamiento de la confusin (en los adultos mayores).  Cmo se diagnostica?  Esta afeccin se puede diagnosticar en funcin de lo siguiente:   Los sntomas y antecedentes mdicos. Le harn preguntas sobre sus sntomas, estilo de vida, alimentacin y los medicamentos que tome.   Examen fsico.  ? Le examinarn el vientre (abdomen).  ? Posiblemente  le hagan un examen rectal. Para este examen, el mdico coloca un dedo enguantado y lubricado en el recto.   Otros estudios para buscar cualquier otra causa subyacente del estreimiento. Estos estudios pueden solicitarse si tiene sangrado en el recto, prdida de peso o antecedentes familiares de cncer de colon. En estos casos, es posible que le soliciten:  ? Estudios de diagnstico por imgenes del colon. Estos pueden incluir una radiografa, una ecografa, o una exploracin por tomografa computarizada (TC).  ? Anlisis de sangre.  ? Un procedimiento para examinar el interior del colon (colonoscopia).  ? Estudios ms especializados para determinar:   Si su esfnter anal funciona bien. El esfnter anal es un msculo con forma de anillo que controla el cierre del ano.   Cmo se mueven los alimentos por el colon.  ? Estudios para medir la seal nerviosa en los msculos del suelo plvico (electromiografa).  Cmo se trata?  El tratamiento del estreimiento crnico depende de su causa. En la mayora de los casos, el tratamiento comienza por:   Volverse ms activo y practicar actividad fsica con regularidad.   Beber ms lquidos.   Agregar fibra a su dieta. Las fuentes de fibra son las frutas, las verduras, los cereales integrales y los suplementos de fibra.   Tomar medicamentos tales como laxantes o medicamentos para aumentar las contracciones en el aparato digestivo (procinticos).   Entrenar los msculos plvicos con biorregulacin.   Ciruga, si hay una obstruccin.  El tratamiento para el estreimiento crnico secundario depende de la afeccin subyacente. Puede que deba hacer lo siguiente:   Dejar de tomar o cambiar ciertos medicamentos si le causan estreimiento.   Usar un suplemento de fibra (laxante formador de masa) o ablandador de las heces.   Tomar un laxante recetado. Este laxante acta al absorber agua en el colon (laxante osmtico).  Es posible que tambin deba consultar a un especialista en  afecciones del aparato digestivo (gastroenterlogo).  Siga estas indicaciones en su casa:     Tome los medicamentos de venta libre y los recetados solamente como se lo haya indicado el mdico.   Si est tomando un laxante, tmelo como se lo haya indicado el mdico.   Consuma una dieta equilibrada que incluya suficiente fibra. Pdale a su mdico que le recomiende una dieta adecuada para usted.   Tome lquidos claros, especialmente, agua. Evite consumir alcohol, cafena y refrescos.   Beba suficiente lquido como para mantener la orina de color amarillo plido.   Haga algo de actividad fsica todos los das. Pregntele al mdico qu actividades fsicas son seguras para usted.   Somtase a estudios de deteccin de cncer de colon segn lo indicado por su mdico.   Concurra a todas las visitas de seguimiento como se lo haya indicado el mdico. Esto es importante.  Comunquese con un mdico si:   Tiene tres o menos deposiciones por semana.   Sus heces son duras o grumosas.   Observa sangre en el papel higinico o en sus heces despus de defecar.   Baja de peso sin causa aparente.     Tiene dolor en el recto (dolor rectal).   Tiene una prdida involuntaria de heces.   Tiene nuseas o vmitos.  Solicite ayuda de inmediato si:   Tiene hemorragia rectal o defeca cogulos de sangre.   Tiene dolor rectal intenso.   Tiene tejido corporal que sale hacia afuera (sobresale) del ano.   Siente dolor intenso o meteorismo (distensin) en el abdomen.   Tiene vmitos que no puede controlar.  Resumen   El estreimiento crnico es una afeccin de las personas que tienen tres o menos deposiciones por semana, durante un perodo de tres meses o ms.   Las personas que tienen mayor riesgo de tener esta afeccin son los adultos mayores, las personas que no beben suficiente agua o no hacen suficiente actividad fsica (son sedentarias).   El tratamiento de esta afeccin depende de la causa. La mayora de los tratamientos  para el estreimiento crnico incluyen incorporar fibra a la dieta, beber ms lquidos y hacer ms actividad fsica. Tambin es posible que sea necesario tratar otras afecciones subyacentes y dejar de tomar o cambiar ciertos medicamentos si causan estreimiento.   Si los cambios en el estilo de vida no alivian el estreimiento, el mdico puede recomendar tomar un laxante.  Esta informacin no tiene como fin reemplazar el consejo del mdico. Asegrese de hacerle al mdico cualquier pregunta que tenga.  Document Released: 01/24/2017 Document Revised: 01/24/2017  Elsevier Interactive Patient Education  2019 Elsevier Inc.

## 2018-10-23 ENCOUNTER — Other Ambulatory Visit: Payer: Self-pay | Admitting: Obstetrics

## 2020-01-28 ENCOUNTER — Inpatient Hospital Stay (HOSPITAL_COMMUNITY): Payer: Self-pay

## 2020-01-28 ENCOUNTER — Encounter (HOSPITAL_COMMUNITY): Payer: Self-pay | Admitting: Obstetrics & Gynecology

## 2020-01-28 ENCOUNTER — Inpatient Hospital Stay (HOSPITAL_COMMUNITY)
Admission: AD | Admit: 2020-01-28 | Discharge: 2020-01-28 | Disposition: A | Discharge status: Home / Self Care | Payer: Self-pay | Attending: Obstetrics & Gynecology | Admitting: Obstetrics & Gynecology

## 2020-01-28 ENCOUNTER — Other Ambulatory Visit: Payer: Self-pay

## 2020-01-28 DIAGNOSIS — O021 Missed abortion: Secondary | ICD-10-CM

## 2020-01-28 DIAGNOSIS — N939 Abnormal uterine and vaginal bleeding, unspecified: Secondary | ICD-10-CM

## 2020-01-28 DIAGNOSIS — Z3A13 13 weeks gestation of pregnancy: Secondary | ICD-10-CM | POA: Insufficient documentation

## 2020-01-28 LAB — COMPREHENSIVE METABOLIC PANEL
ALT: 28 U/L (ref 0–44)
AST: 25 U/L (ref 15–41)
Albumin: 4 g/dL (ref 3.5–5.0)
Alkaline Phosphatase: 54 U/L (ref 38–126)
Anion gap: 9 (ref 5–15)
BUN: 12 mg/dL (ref 6–20)
CO2: 25 mmol/L (ref 22–32)
Calcium: 9 mg/dL (ref 8.9–10.3)
Chloride: 103 mmol/L (ref 98–111)
Creatinine, Ser: 0.7 mg/dL (ref 0.44–1.00)
GFR calc Af Amer: >60 mL/min (ref >60)
GFR calc non Af Amer: >60 mL/min (ref >60)
Glucose, Bld: 97 mg/dL (ref 70–99)
Potassium: 4.3 mmol/L (ref 3.5–5.1)
Sodium: 137 mmol/L (ref 135–145)
Total Bilirubin: 0.4 mg/dL (ref 0.3–1.2)
Total Protein: 7.9 g/dL (ref 6.5–8.1)

## 2020-01-28 LAB — URINALYSIS, ROUTINE W REFLEX MICROSCOPIC
Bacteria, UA: NONE SEEN
Bilirubin Urine: NEGATIVE
Glucose, UA: NEGATIVE mg/dL
Ketones, ur: NEGATIVE mg/dL
Leukocytes,Ua: NEGATIVE
Nitrite: NEGATIVE
Protein, ur: NEGATIVE mg/dL
Specific Gravity, Urine: 1.006 (ref 1.005–1.030)
pH: 7 (ref 5.0–8.0)

## 2020-01-28 LAB — CBC WITH DIFFERENTIAL/PLATELET
Abs Immature Granulocytes: 0.03 10*3/uL (ref 0.00–0.07)
Basophils Absolute: 0 10*3/uL (ref 0.0–0.1)
Basophils Relative: 0 %
Eosinophils Absolute: 0.1 10*3/uL (ref 0.0–0.5)
Eosinophils Relative: 1 %
HCT: 39.6 % (ref 36.0–46.0)
Hemoglobin: 13 g/dL (ref 12.0–15.0)
Immature Granulocytes: 0 %
Lymphocytes Relative: 23 %
Lymphs Abs: 1.9 10*3/uL (ref 0.7–4.0)
MCH: 29.7 pg (ref 26.0–34.0)
MCHC: 32.8 g/dL (ref 30.0–36.0)
MCV: 90.4 fL (ref 80.0–100.0)
Monocytes Absolute: 0.5 10*3/uL (ref 0.1–1.0)
Monocytes Relative: 6 %
Neutro Abs: 5.6 10*3/uL (ref 1.7–7.7)
Neutrophils Relative %: 70 %
Platelets: 299 10*3/uL (ref 150–400)
RBC: 4.38 MIL/uL (ref 3.87–5.11)
RDW: 14.3 % (ref 11.5–15.5)
WBC: 8.1 10*3/uL (ref 4.0–10.5)
nRBC: 0 % (ref 0.0–0.2)

## 2020-01-28 LAB — ABO/RH: ABO/RH(D): A POS

## 2020-01-28 LAB — HCG, QUANTITATIVE, PREGNANCY: hCG, Beta Chain, Quant, S: 4280 m[IU]/mL — ABNORMAL HIGH (ref <5)

## 2020-01-28 NOTE — MAU Note (Signed)
Presents for c/o VB x1 week, reports looks like coffee grounds.  Also c/o lower back pain and occasional lower abdominal pain.  Reports she's [redacted] weeks pregnant, seen @ [facility]. Unsure of LMP.

## 2020-01-28 NOTE — Discharge Instructions (Signed)
Aborto espontneo Miscarriage El aborto espontneo es la prdida de un beb que no ha nacido (feto) antes de la semana20 del embarazo. Siga estas indicaciones en su casa: Medicamentos   Tome los medicamentos de venta libre y los recetados solamente como se lo haya indicado el mdico.  Si le recetaron un antibitico, tmelo como se lo haya indicado el mdico. No deje de tomar los antibiticos aunque comience a sentirse mejor.  No tome antiinflamatorios no esteroideos (AINE), a menos que el mdico le diga que son seguros para usted. Estos incluyen aspirina e ibuprofeno. Estos medicamentos pueden provocarle sangrado. Actividad  Haga reposo segn lo indicado. Pregntele al mdico qu actividades son seguras para usted.  Pida ayuda para realizar las tareas de la casa durante este tiempo. Instrucciones generales  Anote cuntos apsitos usa por da y cun saturados estn.  Observe la cantidad de tejido o grumos de sangre (cogulos de sangre) que expulsa por la vagina. Guarde las cantidades grandes de tejido para llevrselas al mdico.  No use tampones, no se haga duchas vaginales ni tenga relaciones sexuales hasta que el mdico la autorice.  Para que usted y su pareja puedan sobrellevar el proceso de duelo, hable con su mdico o busque apoyo psicolgico.  Cuando est lista, acuda al mdico para hablar sobre los pasos que debe seguir para cuidar su salud. Adems, hable con su mdico sobre las medidas que debe adoptar para tener un embarazo saludable en el futuro.  Concurra a todas las visitas de seguimiento como se lo haya indicado el mdico. Esto es importante. Comunquese con un mdico si:  Tiene fiebre o siente escalofros.  Tiene una secrecin vaginal con mal olor.  Aumenta el sangrado. Solicite ayuda de inmediato si:  Tiene espasmos o dolor muy intensos en el abdomen o en la espalda.  Elimina grumos de sangre por la vagina, que tienen el tamao de una nuez o ms.  Elimina  tejido por la vagina, que tiene el tamao de una nuez o ms.  Empapa ms de un apsito de tamao normal por hora.  Se siente dbil o mareada.  Pierde el conocimiento (se desmaya).  Siente tristeza que no se va o piensa en lastimarse. Resumen  El aborto espontneo es la prdida de un beb que no ha nacido antes de la semana20 del embarazo.  Siga las indicaciones de su mdico para el cuidado en su hogar. Concurra a todas las visitas de control.  Para que usted y su pareja puedan sobrellevar el proceso de duelo, hable con su mdico o busque apoyo psicolgico. Esta informacin no tiene como fin reemplazar el consejo del mdico. Asegrese de hacerle al mdico cualquier pregunta que tenga. Document Revised: 02/13/2017 Document Reviewed: 02/13/2017 Elsevier Patient Education  2020 Elsevier Inc.  

## 2020-01-28 NOTE — MAU Provider Note (Addendum)
[facility] Note  History     CSN: [id number]  Arrival date & time 01/28/20  1558  Chief Complaint  Patient presents with   Vaginal Bleeding    [name] is a 44 y.o. [id number] who presents to MAU today for evaluation of vaginal bleeding.   Pt reports she is 13 weeks by LMP (however LMP was atypical for her; only lasted one day & she has not had an ultrasound or prenatal care yet). She had an initial prenatal appointment scheduled for 9/14 at [facility]. She endorses a 1 week history of brown discharge followed by scant bright red blood when she wiped today. Intermittent abdominal cramping and lower back pain, reports 6/10 currently. All previous pregnancies vaginal deliveries (most recent in 04/2018), no previous miscarriages.   Denies any abnormal vaginal discharge, fevers, chills, sweats, dysuria, nausea, vomiting, other GI or GU symptoms or other general symptoms.  OB History  Gravida Para Term Preterm AB Living  5 4 4  0 0 4  SAB TAB Ectopic Multiple Live Births  0 0 0 0 4    # Outcome Date GA Lbr Len/2nd Weight Sex Delivery Anes PTL Lv  5 Current           4 Term 04/18/18 [redacted]w[redacted]d 11:35 / 00:39 3170 g F Vag-Spont EPI  LIV     Name: Bonsell,GIRL Tallia     Apgar1: 8  Apgar5: 9  3 Term 06/30/04    M Vag-Spont   LIV  2 Term 02/15/00    F Vag-Spont   LIV  1 Term 03/05/94    03/07/94    Past Medical History:  Diagnosis Date   Medical history non-contributory     Past Surgical History:  Procedure Laterality Date   NO PAST SURGERIES      No family history on file.  Social History   Tobacco Use   Smoking status: Never Smoker   Smokeless tobacco: Never Used  Vaping Use   Vaping Use: Never used  Substance Use Topics   Alcohol use: Not Currently   Drug use: Never    No Known Allergies  No medications prior to admission.     Physical Exam  BP 119/70 (BP Location: Right Arm)   Pulse 72   Temp 99 F (37.2 C) (Oral)   Resp 18    SpO2 100%  GENERAL: Well-developed, well-nourished female in no acute distress  PSYCH: Appropriately tearful at times  HEENT: Normocephalic, atraumatic.   LUNGS: Normal respiratory effort HEART: Regular rate noted EXTREMITIES: Normal gait   Labs and Imaging   Results for orders placed or performed during the hospital encounter of 01/28/20 (from the past 24 hour(s))  Urinalysis, Routine w reflex microscopic Urine, Clean Catch     Status: Abnormal   Collection Time: 01/28/20  4:45 PM  Result Value Ref Range   Color, Urine STRAW (A) YELLOW   APPearance CLEAR CLEAR   Specific Gravity, Urine 1.006 1.005 - 1.030   pH 7.0 5.0 - 8.0   Glucose, UA NEGATIVE NEGATIVE mg/dL   Hgb urine dipstick MODERATE (A) NEGATIVE   Bilirubin Urine NEGATIVE NEGATIVE   Ketones, ur NEGATIVE NEGATIVE mg/dL   Protein, ur NEGATIVE NEGATIVE mg/dL   Nitrite NEGATIVE NEGATIVE   Leukocytes,Ua NEGATIVE NEGATIVE   RBC / HPF 0-5 0 - 5 RBC/hpf   WBC, UA 0-5 0 - 5 WBC/hpf   Bacteria, UA NONE SEEN NONE SEEN  hCG, quantitative,  pregnancy     Status: Abnormal   Collection Time: 01/28/20  5:28 PM  Result Value Ref Range   hCG, Beta Chain, Quant, S 4,280 (H) <5 mIU/mL  ABO/Rh     Status: None   Collection Time: 01/28/20  5:28 PM  Result Value Ref Range   ABO/RH(D)      A POS Performed at [facility], [address] [place], [place] [address]   CBC with Differential/Platelet     Status: None   Collection Time: 01/28/20  5:28 PM  Result Value Ref Range   WBC 8.1 4.0 - 10.5 K/uL   RBC 4.38 3.87 - 5.11 MIL/uL   Hemoglobin 13.0 12.0 - 15.0 g/dL   HCT 84.6 36 - 46 %   MCV 90.4 80.0 - 100.0 fL   MCH 29.7 26.0 - 34.0 pg   MCHC 32.8 30.0 - 36.0 g/dL   RDW 96.2 95.2 - 84.1 %   Platelets 299 150 - 400 K/uL   nRBC 0.0 0.0 - 0.2 %   Neutrophils Relative % 70 %   Neutro Abs 5.6 1.7 - 7.7 K/uL   Lymphocytes Relative 23 %   Lymphs Abs 1.9 0.7 - 4.0 K/uL   Monocytes Relative 6 %   Monocytes Absolute 0.5 0 - 1 K/uL    Eosinophils Relative 1 %   Eosinophils Absolute 0.1 0 - 0 K/uL   Basophils Relative 0 %   Basophils Absolute 0.0 0 - 0 K/uL   Immature Granulocytes 0 %   Abs Immature Granulocytes 0.03 0.00 - 0.07 K/uL  Comprehensive metabolic panel     Status: None   Collection Time: 01/28/20  5:28 PM  Result Value Ref Range   Sodium 137 135 - 145 mmol/L   Potassium 4.3 3.5 - 5.1 mmol/L   Chloride 103 98 - 111 mmol/L   CO2 25 22 - 32 mmol/L   Glucose, Bld 97 70 - 99 mg/dL   BUN 12 6 - 20 mg/dL   Creatinine, Ser 3.24 0.44 - 1.00 mg/dL   Calcium 9.0 8.9 - 40.1 mg/dL   Total Protein 7.9 6.5 - 8.1 g/dL   Albumin 4.0 3.5 - 5.0 g/dL   AST 25 15 - 41 U/L   ALT 28 0 - 44 U/L   Alkaline Phosphatase 54 38 - 126 U/L   Total Bilirubin 0.4 0.3 - 1.2 mg/dL   GFR calc non Af Amer >60 >60 mL/min   GFR calc Af Amer >60 >60 mL/min   Anion gap 9 5 - 15   US OB LESS THAN 14 WEEKS WITH OB TRANSVAGINAL  Result Date: 01/28/2020 CLINICAL DATA:  Bleeding EXAM: OBSTETRIC <14 WK [place] AND TRANSVAGINAL OB US TECHNIQUE: Both transabdominal and transvaginal ultrasound examinations were performed for complete evaluation of the gestation as well as the maternal uterus, adnexal regions, and pelvic cul-de-sac. Transvaginal technique was performed to assess early pregnancy. COMPARISON:  None. FINDINGS: Intrauterine gestational sac: Single intrauterine gestational sac Yolk sac:  Visualized but appears enlarged, measuring 11 mm. Embryo:  Not visualized MSD: 33.9 mm   8 w   3 d Subchorionic hemorrhage:  Small to moderate subchorionic hemorrhage. Maternal uterus/adnexae: Ovaries are within normal limits. The right ovary measures 2 x 3.3 x 1.6 cm. The left ovary measures 3 x 1.8 x 2.1 cm. No significant free fluid. IMPRESSION: Enlarged appearing gestational sac with mean sac diameter of 33.9 mm but no embryo. Findings meet definitive criteria for failed pregnancy. This follows  SRU consensus guidelines: Diagnostic Criteria for Nonviable  Pregnancy Early in the First Trimester. [facility]acy Mis J Med [id number]. Small moderate subchorionic hemorrhage Electronically Signed   By: Jasmine Pang M.D.   On: 01/28/2020 18:29    Assessment and Plan   1. Missed abortion   2. Vaginal bleeding    Transvaginal U/S showing gestational sac without embryo, c/w definitive criteria for failed pregnancy around [redacted]w[redacted]d. Serum Bhcg 4,280. Blood type A positive; does not need RHoGAM administration. Hgb stable at 13.0.   Discussed findings and management of missed abortion with patient in Spanish: expectant management vs misoprostol vs D&E.  Risks and benefits of all modalities discussed; all questions answered.  Patient opted for expectant management for now. She was told to call clinic if she changes her mind, or if there is no passage of tissue in 4 weeks or develops any symptoms of infection.  Bleeding precautions reviewed; she was told to call clinic or come to MAU for any concerns.  She will follow up in one week with [facility] on 9/14, sent message to clinic pool to adjust indication for appt (initially scheduled as prenatal visit) and also sent in-basket message to clinic provider (Dr. Gerri Spore). Patient again advised to call or come in for evaluation for any concerning symptoms; bleeding precautions were strictly emphasized.      Follow-up Information     [facility] Follow up in 1 week(s).   Why: keep your appointment as previously scheduled Contact information: [address] [place] [address] [phone]               Allergies as of 01/28/2020   No Known Allergies      Medication List     STOP taking these medications    norethindrone 0.35 MG tablet Commonly known as: MICRONOR       TAKE these medications    docusate sodium 100 MG capsule Commonly known as: Colace Take 1 capsule (100 mg total) by mouth 2 (two) times daily.   ibuprofen 600 MG tablet Commonly known as:  ADVIL Take 1 tablet (600 mg total) by mouth every 6 (six) hours.   Prenatal Vitamin Plus Low Iron 27-1 MG Tabs TAKE 1 TABLET BY MOUTH EVERY DAY BEFORE BREAKFAST        Leticia Penna, DO  Family Medicine PGY-3   Attestation of Supervision of Student:  I confirm that I have verified the information documented in the  resident'  note and that I have also personally reperformed the history, physical exam and all medical decision making activities.  I have verified that all services and findings are accurately documented in this student's note; and I agree with management and plan as outlined in the documentation. I have also made any necessary editorial changes.  Was present for all counseling as noted above. I personally sent in-basket messages to [facility] clinic admin pool and clinic provider (Dr. Gerri Spore) as noted above. Pt confirmed her plan to f/u in clinic as scheduled or sooner as needed.  Sheila Oats, MD [facility], [facility] 01/28/2020 8:34 PM

## 2020-01-28 NOTE — MAU Note (Signed)
Pt was discharged by provider however pt left prior to RN obtaining discharge vital signs or giving paperwork.

## 2020-02-01 ENCOUNTER — Other Ambulatory Visit: Payer: Self-pay

## 2020-02-01 ENCOUNTER — Encounter (HOSPITAL_COMMUNITY): Payer: Self-pay | Admitting: Obstetrics & Gynecology

## 2020-02-01 ENCOUNTER — Inpatient Hospital Stay (HOSPITAL_COMMUNITY)
Admission: AD | Admit: 2020-02-01 | Discharge: 2020-02-01 | Disposition: A | Discharge status: Home / Self Care | Payer: Self-pay | Attending: Obstetrics & Gynecology | Admitting: Obstetrics & Gynecology

## 2020-02-01 DIAGNOSIS — Z3A01 Less than 8 weeks gestation of pregnancy: Secondary | ICD-10-CM | POA: Insufficient documentation

## 2020-02-01 DIAGNOSIS — O039 Complete or unspecified spontaneous abortion without complication: Secondary | ICD-10-CM | POA: Insufficient documentation

## 2020-02-01 LAB — HCG, QUANTITATIVE, PREGNANCY: hCG, Beta Chain, Quant, S: 1151 m[IU]/mL — ABNORMAL HIGH (ref <5)

## 2020-02-01 LAB — CBC
HCT: 35 % — ABNORMAL LOW (ref 36.0–46.0)
Hemoglobin: 11.6 g/dL — ABNORMAL LOW (ref 12.0–15.0)
MCH: 30 pg (ref 26.0–34.0)
MCHC: 33.1 g/dL (ref 30.0–36.0)
MCV: 90.4 fL (ref 80.0–100.0)
Platelets: 272 10*3/uL (ref 150–400)
RBC: 3.87 MIL/uL (ref 3.87–5.11)
RDW: 14.2 % (ref 11.5–15.5)
WBC: 13.8 10*3/uL — ABNORMAL HIGH (ref 4.0–10.5)
nRBC: 0 % (ref 0.0–0.2)

## 2020-02-01 MED ORDER — OXYCODONE-ACETAMINOPHEN 5-325 MG PO TABS: 2.0000 | ORAL_TABLET | ORAL | 0 refills | Status: AC | PRN

## 2020-02-01 MED ORDER — MISOPROSTOL 200 MCG PO TABS: 600.0000 ug | ORAL_TABLET | Freq: Once | ORAL | Status: DC

## 2020-02-01 MED ORDER — IBUPROFEN 800 MG PO TABS
800.0000 mg | ORAL_TABLET | Freq: Once | ORAL | Status: AC
  Administered 2020-02-01: 800 mg via ORAL
  Filled 2020-02-01: qty 1

## 2020-02-01 MED ORDER — MISOPROSTOL 200 MCG PO TABS
600.0000 ug | ORAL_TABLET | Freq: Once | ORAL | Status: AC
  Administered 2020-02-01: 600 ug via BUCCAL
  Filled 2020-02-01: qty 3

## 2020-02-01 MED ORDER — HYDROMORPHONE HCL 1 MG/ML IJ SOLN
1.0000 mg | Freq: Once | INTRAMUSCULAR | Status: AC
  Administered 2020-02-01: 1 mg via INTRAMUSCULAR
  Filled 2020-02-01: qty 1

## 2020-02-01 MED ORDER — IBUPROFEN 600 MG PO TABS: 600.0000 mg | ORAL_TABLET | Freq: Three times a day (TID) | ORAL | 0 refills | Status: AC

## 2020-02-01 NOTE — Discharge Instructions (Signed)
Aborto espontneo Miscarriage El aborto espontneo es la prdida de un beb que no ha nacido (feto) antes de la semana20 del embarazo. La mayor parte de los abortos espontneos ocurre durante los primeros 3meses de embarazo. A veces, un aborto ocurre antes de que la mujer sepa que est embarazada. El aborto espontneo puede ser una experiencia que afecte emocionalmente a la persona. Si ha sufrido un aborto espontneo, hable con su mdico y hgale las preguntas que tenga sobre el aborto espontneo, el proceso de duelo y los planes futuros de embarazo. Cules son las causas? Entre las causas de un aborto espontneo se incluyen las siguientes:  Problemas genticos o cromosmicos del feto. Estos problemas impiden que el beb se desarrolle con normalidad. En general, son el resultado de errores fortuitos que ocurren en la etapa temprana del desarrollo y que no se transmiten de padres a hijos (no se heredan).  Infeccin en el cuello del tero.  Trastornos que afectan el equilibrio hormonal del organismo.  Problemas en el cuello del tero, como su adelgazamiento y apertura antes de que el embarazo llegue a trmino (insuficiencia del cuello de tero).  Problemas en el tero. Estos pueden incluir, entre otros, los siguientes: ? Forma anormal del tero. ? Fibromas en el tero. ? Anormalidades congnitas. Estos son problemas que ya estaban presentes en el nacimiento.  Ciertas enfermedades crnicas.  Fumar, beber alcohol o usar drogas.  Lesiones (traumatismos). En muchos de los casos, se desconoce la causa de los abortos espontneos. Cules son los signos o los sntomas? Los sntomas de esta afeccin incluyen los siguientes:  Sangrado o manchado vaginal, con o sin clicos o dolor.  Dolor o clicos en el abdomen o en la parte inferior de la espalda.  Eliminacin de lquido, tejidos o cogulos sanguneos por la vagina. Cmo se diagnostica? Esta afeccin se puede diagnosticar en funcin de  lo siguiente:  Examen fsico.  Ecografa.  Anlisis de sangre.  Anlisis de orina. Cmo se trata? En algunos casos, el tratamiento de un aborto espontneo no es necesario si se eliminan de forma natural todos los tejidos que se encontraban en el tero. Si fuera necesario realizar un tratamiento por esta afeccin, este puede incluir lo siguiente:  Dilatacin y curetaje (D&C). Mediante este procedimiento, se expande el cuello del tero y se raspan las paredes (endometrio). Esto se realiza solamente si queda tejido del feto o la placenta dentro del cuerpo (aborto espontneo incompleto).  Medicamentos, por ejemplo: ? Antibiticos para tratar una infeccin. ? Medicamentos para ayudar al cuerpo a eliminar los restos de tejido. ? Medicamentos para reducir (contraer) el tamao del tero. Estos medicamentos se pueden administrar si tiene un sangrado abundante. Si su factor sanguneo es Rhnegativo y el de su beb es Rhpositivo, usted necesitar una inyeccin del medicamento llamado inmunoglobulinaRhpara proteger a los bebs futuros de tener problemas con el factorsanguneoRh. Los trminos "Rhnegativo" y "Rhpositivo" hacen referencia a la presencia o no en la sangre de una protena especfica que se encuentra en la superficie de los glbulos rojos (factor Rh). Siga estas indicaciones en su casa: Medicamentos   Tome los medicamentos de venta libre y los recetados solamente como se lo haya indicado el mdico.  Si le recetaron antibiticos, tmelos como se lo haya indicado el mdico. No deje de tomar los antibiticos aunque comience a sentirse mejor.  No tome antiinflamatorios no esteroideos (AINE), tales como aspirina e ibuprofeno, a menos que se lo indique el mdico. Estos medicamentos pueden provocarle sangrado. Actividad  Haga   reposo segn lo indicado. Pregntele al mdico qu actividades son seguras para usted.  Pdale a alguien que la ayude con las responsabilidades familiares y del  hogar durante este tiempo. Instrucciones generales  Lleve un registro de la cantidad y la saturacin de las toallas higinicas que utiliza cada da. Anote esta informacin.  Anote la cantidad de tejido o cogulos sanguneos que expulsa por la vagina. Guarde las cantidades grandes de tejidos para que el mdico los examine.  No use tampones, no se haga duchas vaginales ni tenga relaciones sexuales hasta que el mdico la autorice.  Para que usted y su pareja puedan sobrellevar el proceso del duelo, hable con su mdico o busque apoyo psicolgico.  Cuando est lista, visite a su mdico para hablar sobre los pasos importantes que deber seguir en relacin con su salud. Tambin hable sobre las medidas que deber tomar para tener un embarazo saludable en el futuro.  Concurra a todas las visitas de seguimiento como se lo haya indicado el mdico. Esto es importante. Dnde encontrar ms informacin  Colegio Estadounidense de Obstetras y Gineclogos (American College of Obstetricians and Gynecologists): www.acog.org  Departamento de Salud y Servicios Humanos de los [place], Oficina de Salud de la Mujer (U.S. Department of Health and Human Services, Office on Women's Health): www.womenshealth.gov Comunquese con un mdico si:  Tiene fiebre o siente escalofros.  Tiene una secrecin vaginal con mal olor.  El sangrado aumenta en vez de disminuir. Solicite ayuda de inmediato si:  Siente calambres intensos o dolor en la espalda o en el abdomen.  Elimina cogulos de sangre o tejido por la vagina del tamao de una nuez o ms grandes.  Necesita ms de una toalla higinica de tamao regular por hora.  Se siente mareada o dbil.  Se desmaya.  Siente una tristeza que la invade o piensa en lastimarse. Resumen  La mayor parte de los abortos espontneos ocurre durante los primeros 3meses de embarazo. En algunos casos, el aborto espontneo ocurre antes de que la mujer sepa que est  embarazada.  Siga las indicaciones del mdico para el cuidado en el hogar. Concurra a todas las visitas de control.  Para que usted y su pareja puedan sobrellevar el proceso del duelo, hable con su mdico o busque apoyo psicolgico. Esta informacin no tiene como fin reemplazar el consejo del mdico. Asegrese de hacerle al mdico cualquier pregunta que tenga. Document Revised: 02/13/2017 Document Reviewed: 02/13/2017 Elsevier Patient Education  2020 Elsevier Inc.  

## 2020-02-01 NOTE — MAU Note (Signed)
Linda Dean is a 44 y.o. here in MAU reporting: started having pain around 10 this morning and then started seeing many big clots, states they were grapefruit size. Has changed a pad 4 times, states bleeding is heavy. Having lower abdominal pain.  Onset of complaint: today  Pain score: 7/10  Vitals:   02/01/20 1552  BP: 102/70  Pulse: 85  Resp: 16  Temp: 98.5 F (36.9 C)  SpO2: 100%     Lab orders placed from triage: none

## 2020-02-01 NOTE — MAU Provider Note (Signed)
History     CSN: [id number]  Arrival date and time: 02/01/20 1535   First Provider Initiated Contact with Patient 02/01/20 1617      Chief Complaint  Patient presents with  . Abdominal Pain  . Vaginal Bleeding   HPI Linda Dean is a 44 y.o. [id number] at [redacted] weeks gestation who presents with abdominal pain and vaginal bleeding. She was seen on 9/7 and diagnosed with a missed AB. She choose expectant management. She states she started cramping this am and had bleeding with clots. She states this afternoon then pain became a 10/10.   OB History    Gravida  5   Para  4   Term  4   Preterm      AB      Living  4     SAB      TAB      Ectopic      Multiple  0   Live Births  4           Past Medical History:  Diagnosis Date  . Medical history non-contributory     Past Surgical History:  Procedure Laterality Date  . NO PAST SURGERIES      History reviewed. No pertinent family history.  Social History   Tobacco Use  . Smoking status: Never Smoker  . Smokeless tobacco: Never Used  Vaping Use  . Vaping Use: Never used  Substance Use Topics  . Alcohol use: Not Currently  . Drug use: Never    Allergies: No Known Allergies  Medications Prior to Admission  Medication Sig Dispense Refill Last Dose  . docusate sodium (COLACE) 100 MG capsule Take 1 capsule (100 mg total) by mouth 2 (two) times daily. 60 capsule 11   . ibuprofen (ADVIL,MOTRIN) 600 MG tablet Take 1 tablet (600 mg total) by mouth every 6 (six) hours. 30 tablet 0   . Prenatal Vit-Fe Fumarate-FA (PRENATAL VITAMIN PLUS LOW IRON) 27-1 MG TABS TAKE 1 TABLET BY MOUTH EVERY DAY BEFORE BREAKFAST 90 tablet 4     Review of Systems  Constitutional: Negative.  Negative for fatigue and fever.  HENT: Negative.   Respiratory: Negative.  Negative for shortness of breath.   Cardiovascular: Negative.  Negative for chest pain.  Gastrointestinal: Positive for abdominal pain. Negative for  constipation, diarrhea, nausea and vomiting.  Genitourinary: Positive for vaginal bleeding. Negative for dysuria and vaginal discharge.  Neurological: Negative.  Negative for dizziness and headaches.   Physical Exam   Blood pressure 102/70, pulse 85, temperature 98.5 F (36.9 C), temperature source Oral, resp. rate 16, SpO2 100 %, currently breastfeeding.  Physical Exam Vitals and nursing note reviewed.  Constitutional:      General: She is not in acute distress.    Appearance: She is well-developed.  HENT:     Head: Normocephalic.  Eyes:     Pupils: Pupils are equal, round, and reactive to light.  Cardiovascular:     Rate and Rhythm: Normal rate and regular rhythm.     Heart sounds: Normal heart sounds.  Pulmonary:     Effort: Pulmonary effort is normal. No respiratory distress.     Breath sounds: Normal breath sounds.  Abdominal:     General: Bowel sounds are normal. There is no distension.     Palpations: Abdomen is soft.     Tenderness: There is no abdominal tenderness.  Genitourinary:    Comments: SSE: large amount of bright red bleeding in vault  evacuated with 5 fox swabs. Large POC visualized in cervix. Removed with ring forceps. Bleeding immediately slowed.  Skin:    General: Skin is warm and dry.  Neurological:     Mental Status: She is alert and oriented to person, place, and time.  Psychiatric:        Behavior: Behavior normal.        Thought Content: Thought content normal.        Judgment: Judgment normal.     MAU Course  Procedures Results for orders placed or performed during the hospital encounter of 02/01/20 (from the past 24 hour(s))  CBC     Status: Abnormal   Collection Time: 02/01/20  5:13 PM  Result Value Ref Range   WBC 13.8 (H) 4.0 - 10.5 K/uL   RBC 3.87 3.87 - 5.11 MIL/uL   Hemoglobin 11.6 (L) 12.0 - 15.0 g/dL   HCT 62.0 (L) 36 - 46 %   MCV 90.4 80.0 - 100.0 fL   MCH 30.0 26.0 - 34.0 pg   MCHC 33.1 30.0 - 36.0 g/dL   RDW 35.5 97.4 - 16.3  %   Platelets 272 150 - 400 K/uL   nRBC 0.0 0.0 - 0.2 %  hCG, quantitative, pregnancy     Status: Abnormal   Collection Time: 02/01/20  5:13 PM  Result Value Ref Range   hCG, Beta Chain, Quant, S 1,151 (H) <5 mIU/mL   MDM Dilaudid IM CBC HCG  Discussed with patient cytotec to facilitate passing clots and help with bleeding. Discussed that cytotec can cause more cramping and bleeding but beneficial in this process. Patient verbalized understanding.   Ibuprofen Cytotec  Patient with minimal bleeding and cramping. Will d/c home to follow up in the office for SAB follow up  Assessment and Plan   1. Miscarriage   2. [redacted] weeks gestation of pregnancy    -Discharge home in stable condition -Rx for percocet and ibuprofen sent to patient's pharmacy -Vaginal bleeding and pain precautions discussed -Patient advised to follow-up with [facility] as scheduled -Patient may return to MAU as needed or if her condition were to change or worsen   Rolm Bookbinder CNM 02/01/2020, 4:17 PM

## 2020-02-04 ENCOUNTER — Encounter: Payer: Self-pay | Admitting: Obstetrics and Gynecology

## 2020-02-04 ENCOUNTER — Ambulatory Visit (INDEPENDENT_AMBULATORY_CARE_PROVIDER_SITE_OTHER): Payer: Self-pay | Admitting: Obstetrics and Gynecology

## 2020-02-04 ENCOUNTER — Other Ambulatory Visit: Payer: Self-pay

## 2020-02-04 VITALS — BP 104/68 | HR 71 | Wt 141.0 lb

## 2020-02-04 DIAGNOSIS — O039 Complete or unspecified spontaneous abortion without complication: Secondary | ICD-10-CM

## 2020-02-04 NOTE — Progress Notes (Signed)
   Subjective:    Patient ID: Linda Dean, female    DOB: 09-07-1975, 44 y.o.   MRN: [id number]  This 12 y0 [id number] presents s/p complete abortion managed in the emergency department on 02/01/2020.  Currently, the patient is without complaints.  She reports mild-moderate vaginal bleeding consistent with her normal menses.  She denies any fever, chills, abdominal pain, or abnormal vaginal discharge.  The patient desires to attempt pregnancy in the near future.         Review of Systems  Constitutional: Negative for chills, diaphoresis, fatigue and fever.  Respiratory: Negative for cough, chest tightness and shortness of breath.   Cardiovascular: Negative for chest pain and palpitations.  Gastrointestinal: Negative for constipation, nausea and vomiting.  Genitourinary: Negative for dysuria and frequency.  Skin: Negative for rash.  Neurological: Negative for dizziness, syncope, light-headedness and headaches.  Psychiatric/Behavioral: Negative for dysphoric mood. The patient is not nervous/anxious.        Objective:   Physical Exam Constitutional:      General: She is not in acute distress.    Appearance: Normal appearance.  HENT:     Head: Normocephalic.  Cardiovascular:     Rate and Rhythm: Normal rate and regular rhythm.  Pulmonary:     Breath sounds: Normal breath sounds.  Abdominal:     General: Bowel sounds are normal.     Palpations: Abdomen is soft.     Tenderness: There is no abdominal tenderness.  Skin:    General: Skin is warm and dry.  Neurological:     Mental Status: She is alert and oriented to person, place, and time.  Psychiatric:        Mood and Affect: Mood normal.        Judgment: Judgment normal.           Assessment & Plan:   Encounter Diagnosis  Name Primary?  . Complete abortion Yes   - The potential causes of miscarriages discussed. - Advanced maternal age and the potential for miscarriage discussed. - Recommend for patient to  wait 2 normal menses prior to attempting pregnancy. - Patient to continue PNV and folic acid. - Patient given strict bleeding, fever, and pain precautions.

## 2020-02-04 NOTE — Progress Notes (Signed)
43 y.o. presents for SAB FU.  C/o back pain 5/10 and bleeding with blood clots.  Denies fever, chills NV.

## 2020-02-04 NOTE — Patient Instructions (Signed)
Aborto espontneo Miscarriage El aborto espontneo es la prdida de un beb que no ha nacido (feto) antes de la semana20 del embarazo. La mayor parte de los abortos espontneos ocurre durante los primeros 3meses de embarazo. A veces, un aborto ocurre antes de que la mujer sepa que est embarazada. El aborto espontneo puede ser una experiencia que afecte emocionalmente a la persona. Si ha sufrido un aborto espontneo, hable con su mdico y hgale las preguntas que tenga sobre el aborto espontneo, el proceso de duelo y los planes futuros de embarazo. Cules son las causas? Entre las causas de un aborto espontneo se incluyen las siguientes:  Problemas genticos o cromosmicos del feto. Estos problemas impiden que el beb se desarrolle con normalidad. En general, son el resultado de errores fortuitos que ocurren en la etapa temprana del desarrollo y que no se transmiten de padres a hijos (no se heredan).  Infeccin en el cuello del tero.  Trastornos que afectan el equilibrio hormonal del organismo.  Problemas en el cuello del tero, como su adelgazamiento y apertura antes de que el embarazo llegue a trmino (insuficiencia del cuello de tero).  Problemas en el tero. Estos pueden incluir, entre otros, los siguientes: ? Forma anormal del tero. ? Fibromas en el tero. ? Anormalidades congnitas. Estos son problemas que ya estaban presentes en el nacimiento.  Ciertas enfermedades crnicas.  Fumar, beber alcohol o usar drogas.  Lesiones (traumatismos). En muchos de los casos, se desconoce la causa de los abortos espontneos. Cules son los signos o los sntomas? Los sntomas de esta afeccin incluyen los siguientes:  Sangrado o manchado vaginal, con o sin clicos o dolor.  Dolor o clicos en el abdomen o en la parte inferior de la espalda.  Eliminacin de lquido, tejidos o cogulos sanguneos por la vagina. Cmo se diagnostica? Esta afeccin se puede diagnosticar en funcin de  lo siguiente:  Examen fsico.  Ecografa.  Anlisis de sangre.  Anlisis de orina. Cmo se trata? En algunos casos, el tratamiento de un aborto espontneo no es necesario si se eliminan de forma natural todos los tejidos que se encontraban en el tero. Si fuera necesario realizar un tratamiento por esta afeccin, este puede incluir lo siguiente:  Dilatacin y curetaje (D&C). Mediante este procedimiento, se expande el cuello del tero y se raspan las paredes (endometrio). Esto se realiza solamente si queda tejido del feto o la placenta dentro del cuerpo (aborto espontneo incompleto).  Medicamentos, por ejemplo: ? Antibiticos para tratar una infeccin. ? Medicamentos para ayudar al cuerpo a eliminar los restos de tejido. ? Medicamentos para reducir (contraer) el tamao del tero. Estos medicamentos se pueden administrar si tiene un sangrado abundante. Si su factor sanguneo es Rhnegativo y el de su beb es Rhpositivo, usted necesitar una inyeccin del medicamento llamado inmunoglobulinaRhpara proteger a los bebs futuros de tener problemas con el factorsanguneoRh. Los trminos "Rhnegativo" y "Rhpositivo" hacen referencia a la presencia o no en la sangre de una protena especfica que se encuentra en la superficie de los glbulos rojos (factor Rh). Siga estas indicaciones en su casa: Medicamentos   Tome los medicamentos de venta libre y los recetados solamente como se lo haya indicado el mdico.  Si le recetaron antibiticos, tmelos como se lo haya indicado el mdico. No deje de tomar los antibiticos aunque comience a sentirse mejor.  No tome antiinflamatorios no esteroideos (AINE), tales como aspirina e ibuprofeno, a menos que se lo indique el mdico. Estos medicamentos pueden provocarle sangrado. Actividad  Haga   reposo segn lo indicado. Pregntele al mdico qu actividades son seguras para usted.  Pdale a alguien que la ayude con las responsabilidades familiares y del  hogar durante este tiempo. Instrucciones generales  Lleve un registro de la cantidad y la saturacin de las toallas higinicas que utiliza cada da. Anote esta informacin.  Anote la cantidad de tejido o cogulos sanguneos que expulsa por la vagina. Guarde las cantidades grandes de tejidos para que el mdico los examine.  No use tampones, no se haga duchas vaginales ni tenga relaciones sexuales hasta que el mdico la autorice.  Para que usted y su pareja puedan sobrellevar el proceso del duelo, hable con su mdico o busque apoyo psicolgico.  Cuando est lista, visite a su mdico para hablar sobre los pasos importantes que deber seguir en relacin con su salud. Tambin hable sobre las medidas que deber tomar para tener un embarazo saludable en el futuro.  Concurra a todas las visitas de seguimiento como se lo haya indicado el mdico. Esto es importante. Dnde encontrar ms informacin  Colegio Estadounidense de Obstetras y Gineclogos (American College of Obstetricians and Gynecologists): www.acog.org  Departamento de Salud y Servicios Humanos de los [place], Oficina de Salud de la Mujer (U.S. Department of Health and Human Services, Office on Women's Health): www.womenshealth.gov Comunquese con un mdico si:  Tiene fiebre o siente escalofros.  Tiene una secrecin vaginal con mal olor.  El sangrado aumenta en vez de disminuir. Solicite ayuda de inmediato si:  Siente calambres intensos o dolor en la espalda o en el abdomen.  Elimina cogulos de sangre o tejido por la vagina del tamao de una nuez o ms grandes.  Necesita ms de una toalla higinica de tamao regular por hora.  Se siente mareada o dbil.  Se desmaya.  Siente una tristeza que la invade o piensa en lastimarse. Resumen  La mayor parte de los abortos espontneos ocurre durante los primeros 3meses de embarazo. En algunos casos, el aborto espontneo ocurre antes de que la mujer sepa que est  embarazada.  Siga las indicaciones del mdico para el cuidado en el hogar. Concurra a todas las visitas de control.  Para que usted y su pareja puedan sobrellevar el proceso del duelo, hable con su mdico o busque apoyo psicolgico. Esta informacin no tiene como fin reemplazar el consejo del mdico. Asegrese de hacerle al mdico cualquier pregunta que tenga. Document Revised: 02/13/2017 Document Reviewed: 02/13/2017 Elsevier Patient Education  2020 Elsevier Inc.  

## 2020-02-10 ENCOUNTER — Other Ambulatory Visit: Payer: Self-pay

## 2020-02-10 ENCOUNTER — Other Ambulatory Visit: Payer: Self-pay | Admitting: General Practice

## 2020-02-10 DIAGNOSIS — O039 Complete or unspecified spontaneous abortion without complication: Secondary | ICD-10-CM

## 2020-02-25 ENCOUNTER — Ambulatory Visit: Payer: Self-pay | Admitting: Advanced Practice Midwife

## 2022-03-21 IMAGING — US US OB < 14 WEEKS - US OB TV
1 series · 15 of 28 positions shown · non-contrast
Comparison: None.

CLINICAL DATA: Bleeding

EXAM:
OBSTETRIC <14 WK US AND TRANSVAGINAL OB US
TECHNIQUE: Both transabdominal and transvaginal ultrasound examinations were
performed for complete evaluation of the gestation as well as the
maternal uterus, adnexal regions, and pelvic cul-de-sac.
Transvaginal technique was performed to assess early pregnancy.

[Series 1: us ob < 14 weeks - us ob tv · 15 of 88 slices shown]
[im 1/88]
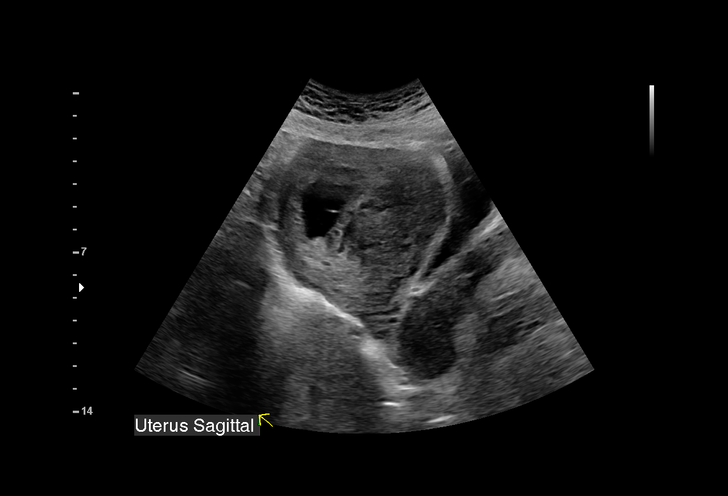
[im 7/88]
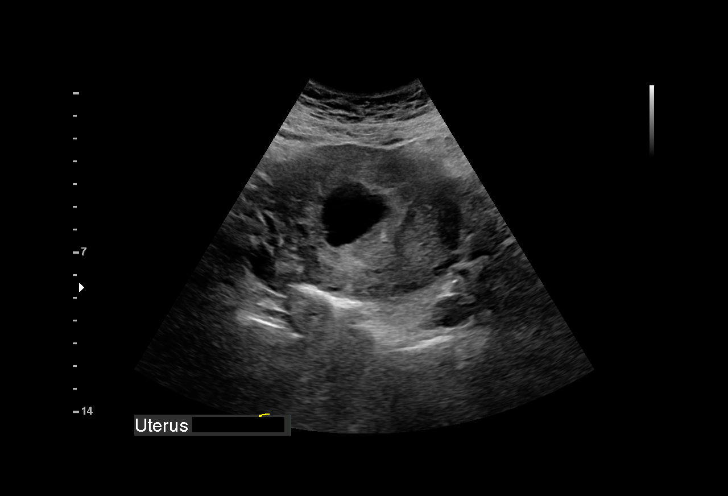
[im 13/88]
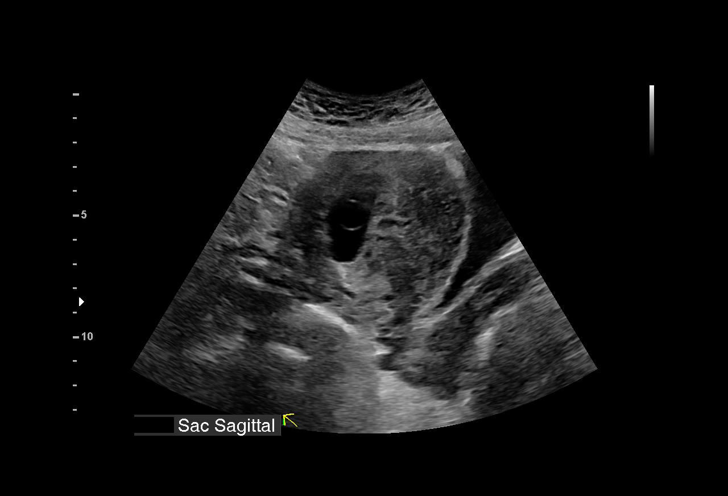
[im 20/88]
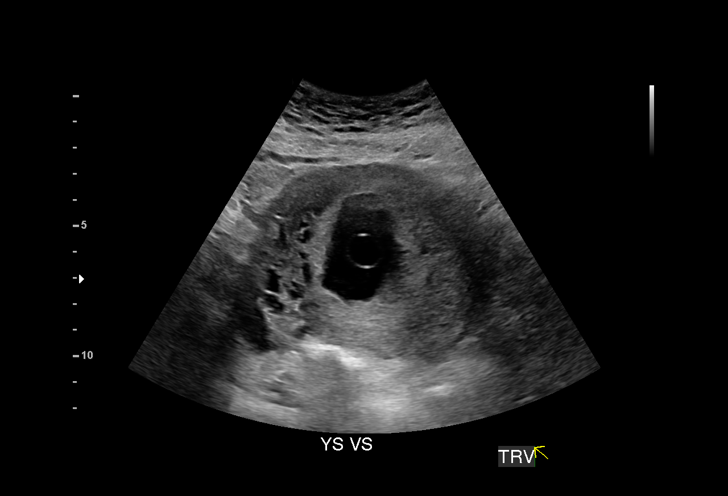
[im 26/88]
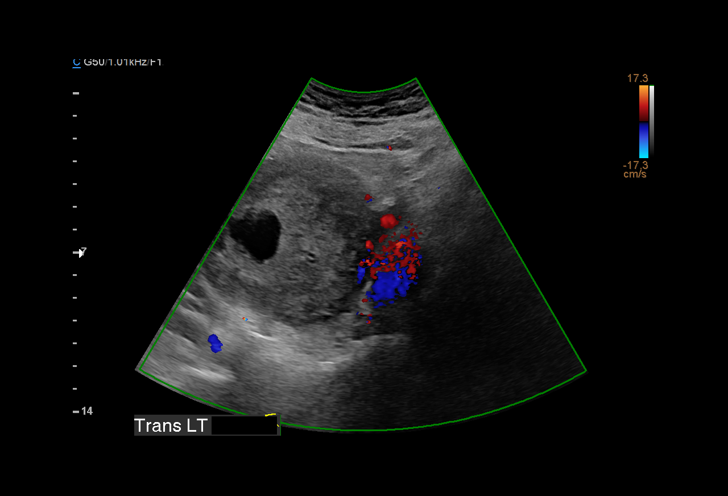
[im 33/88]
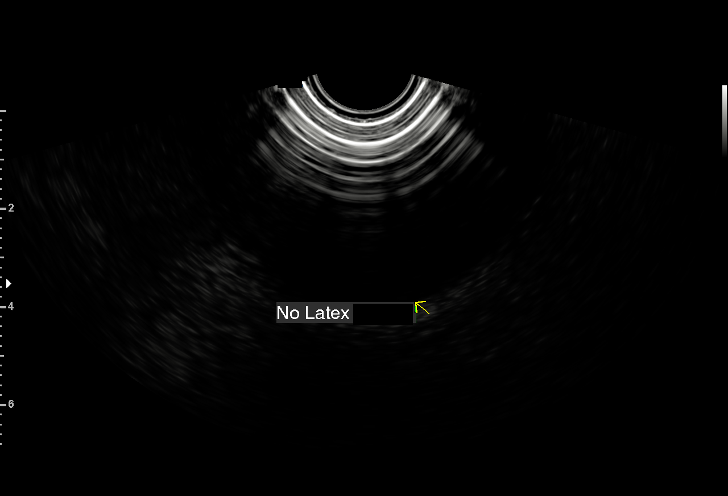
[im 39/88]
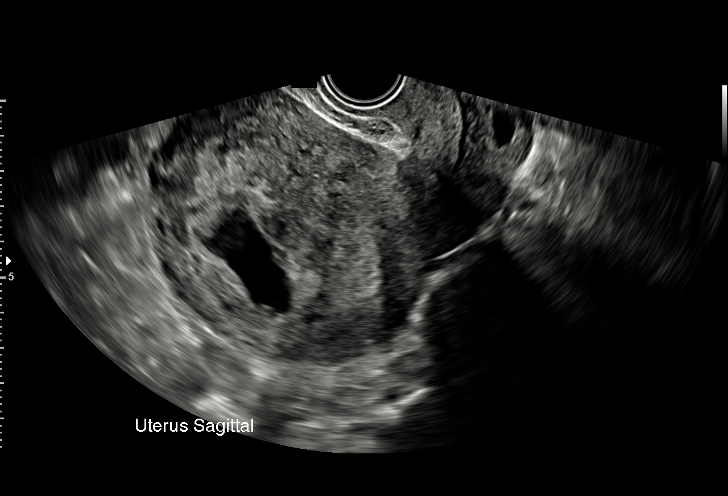
[im 46/88]
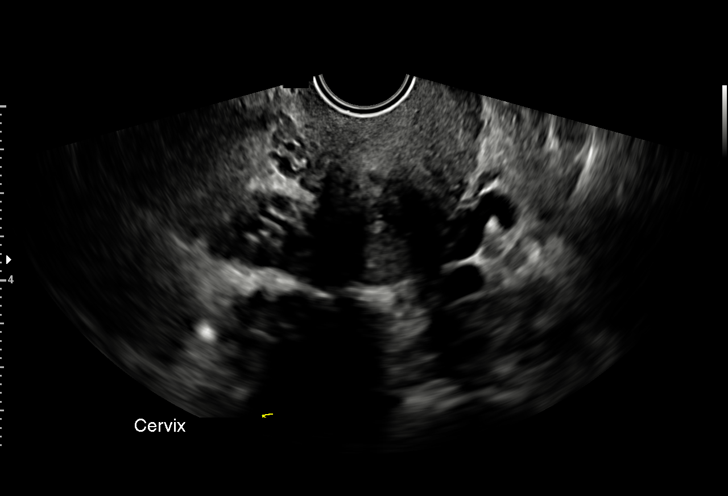
[im 49/88]
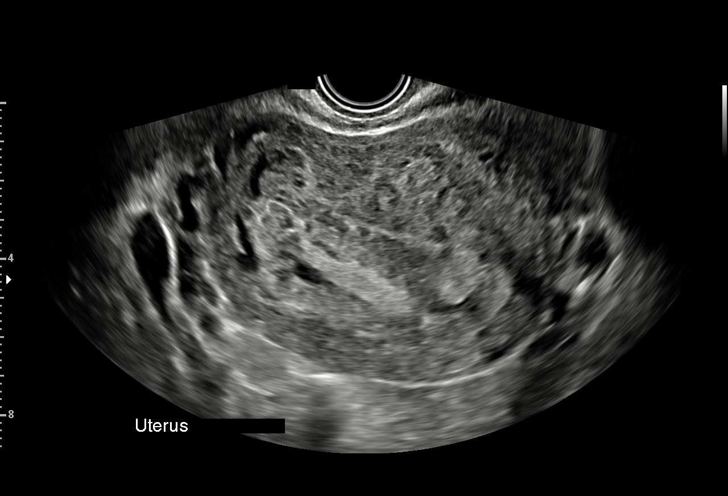
[im 55/88]
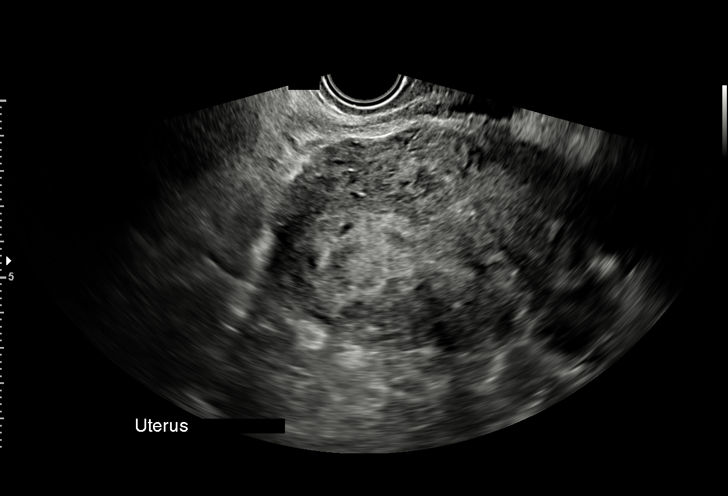
[im 62/88]
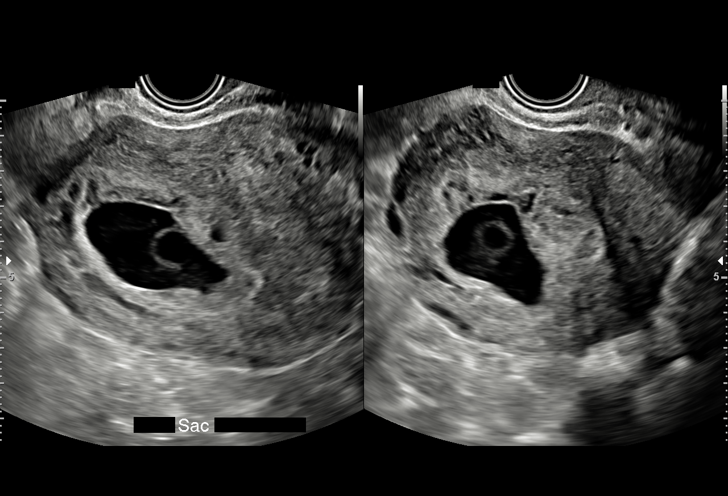
[im 68/88]
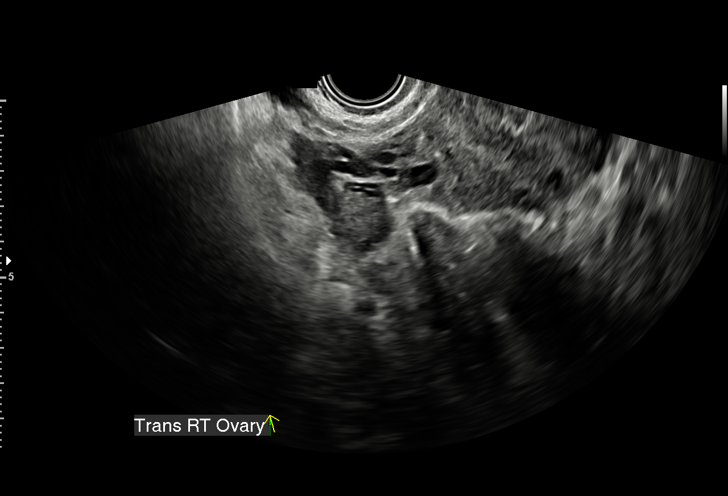
[im 75/88]
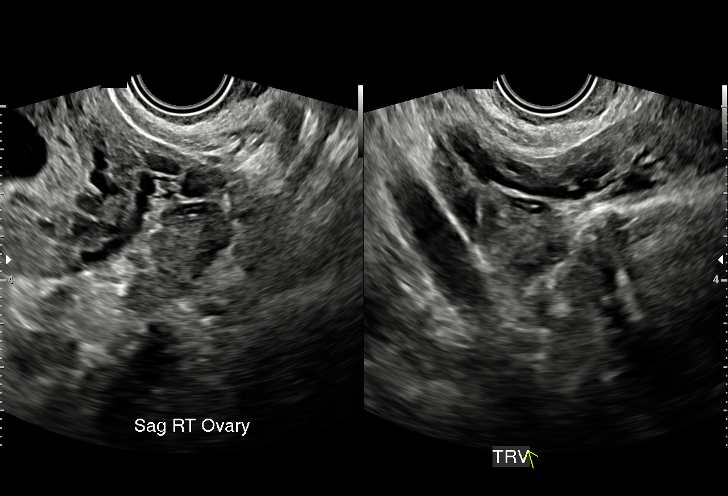
[im 81/88]
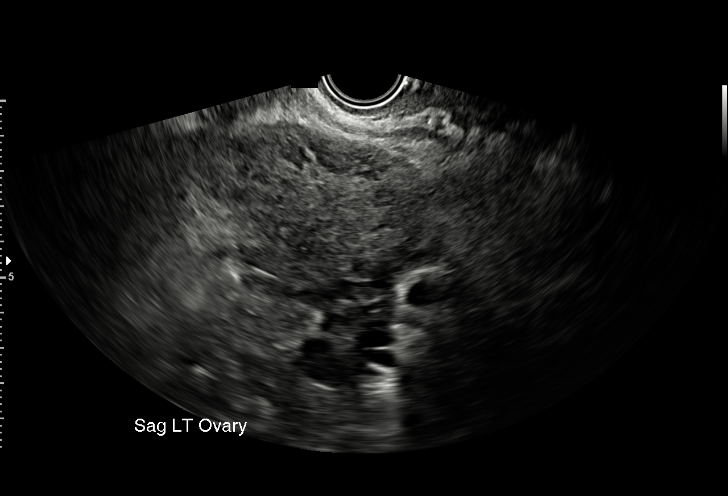
[im 88/88]
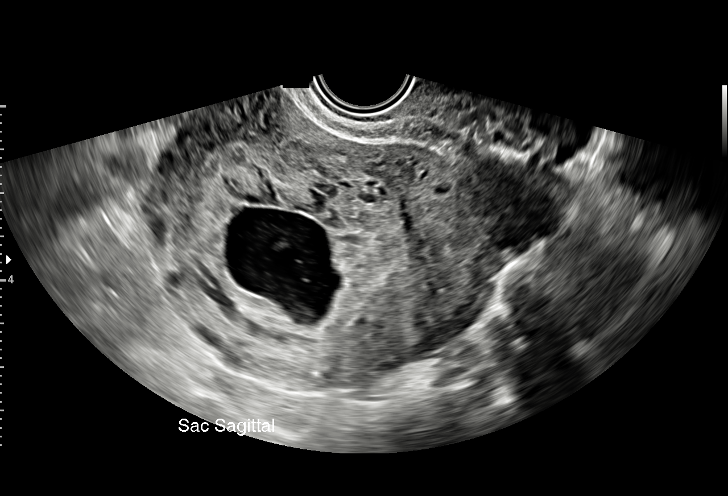

[15 of 28 positions shown; findings below may reference images not displayed]

FINDINGS: Intrauterine gestational sac: Single intrauterine gestational sac

Yolk sac:  Visualized but appears enlarged, measuring 11 mm.

Embryo:  Not visualized

MSD: 33.9 mm   8 w   3 d

Subchorionic hemorrhage:  Small to moderate subchorionic hemorrhage.

Maternal uterus/adnexae: Ovaries are within normal limits. The right
ovary measures 2 x 3.3 x 1.6 cm. The left ovary measures 3 x 1.8 x
2.1 cm. No significant free fluid.
IMPRESSION: Enlarged appearing gestational sac with mean sac diameter of 33.9 mm
but no embryo. Findings meet definitive criteria for failed
pregnancy. This follows SRU consensus guidelines: Diagnostic
Criteria for Nonviable Pregnancy Early in the First Trimester. N
Engl J Med 1985;[DATE].

Small moderate subchorionic hemorrhage
